# Patient Record
Sex: Male | Born: 1935 | Race: White | Hispanic: No | Smoking: Former smoker
Health system: Southern US, Community
[De-identification: ages and names within clinical notes are randomized; demographics above are authoritative.]

## PROBLEM LIST (undated history)

## (undated) DIAGNOSIS — C61 Malignant neoplasm of prostate: Secondary | ICD-10-CM

## (undated) DIAGNOSIS — T4145XA Adverse effect of unspecified anesthetic, initial encounter: Secondary | ICD-10-CM

## (undated) DIAGNOSIS — M720 Palmar fascial fibromatosis [Dupuytren]: Secondary | ICD-10-CM

## (undated) DIAGNOSIS — T8859XA Other complications of anesthesia, initial encounter: Secondary | ICD-10-CM

## (undated) DIAGNOSIS — E785 Hyperlipidemia, unspecified: Secondary | ICD-10-CM

## (undated) HISTORY — PX: ROTATOR CUFF REPAIR: SHX139

---

## 2004-05-01 ENCOUNTER — Encounter: Admission: RE | Admit: 2004-05-01 | Discharge: 2004-05-01 | Payer: Self-pay | Admitting: *Deleted

## 2004-05-02 ENCOUNTER — Ambulatory Visit (HOSPITAL_BASED_OUTPATIENT_CLINIC_OR_DEPARTMENT_OTHER): Admission: RE | Admit: 2004-05-02 | Discharge: 2004-05-02 | Payer: Self-pay | Admitting: *Deleted

## 2004-05-02 ENCOUNTER — Ambulatory Visit (HOSPITAL_COMMUNITY): Admission: RE | Admit: 2004-05-02 | Discharge: 2004-05-02 | Payer: Self-pay | Admitting: *Deleted

## 2016-12-16 DIAGNOSIS — Z23 Encounter for immunization: Secondary | ICD-10-CM | POA: Diagnosis not present

## 2016-12-20 DIAGNOSIS — R39198 Other difficulties with micturition: Secondary | ICD-10-CM | POA: Diagnosis not present

## 2016-12-20 DIAGNOSIS — N401 Enlarged prostate with lower urinary tract symptoms: Secondary | ICD-10-CM | POA: Diagnosis not present

## 2016-12-30 DIAGNOSIS — R7989 Other specified abnormal findings of blood chemistry: Secondary | ICD-10-CM | POA: Diagnosis not present

## 2017-02-14 DIAGNOSIS — H524 Presbyopia: Secondary | ICD-10-CM | POA: Diagnosis not present

## 2017-02-14 DIAGNOSIS — Z0100 Encounter for examination of eyes and vision without abnormal findings: Secondary | ICD-10-CM | POA: Diagnosis not present

## 2017-02-26 DIAGNOSIS — N400 Enlarged prostate without lower urinary tract symptoms: Secondary | ICD-10-CM | POA: Diagnosis not present

## 2017-02-26 DIAGNOSIS — E7800 Pure hypercholesterolemia, unspecified: Secondary | ICD-10-CM | POA: Diagnosis not present

## 2017-02-26 DIAGNOSIS — R7989 Other specified abnormal findings of blood chemistry: Secondary | ICD-10-CM | POA: Diagnosis not present

## 2017-02-26 DIAGNOSIS — Z299 Encounter for prophylactic measures, unspecified: Secondary | ICD-10-CM | POA: Diagnosis not present

## 2017-02-26 DIAGNOSIS — Z713 Dietary counseling and surveillance: Secondary | ICD-10-CM | POA: Diagnosis not present

## 2017-02-26 DIAGNOSIS — R972 Elevated prostate specific antigen [PSA]: Secondary | ICD-10-CM | POA: Diagnosis not present

## 2017-05-05 DIAGNOSIS — R69 Illness, unspecified: Secondary | ICD-10-CM | POA: Diagnosis not present

## 2017-05-12 DIAGNOSIS — L570 Actinic keratosis: Secondary | ICD-10-CM | POA: Diagnosis not present

## 2017-05-12 DIAGNOSIS — Z85828 Personal history of other malignant neoplasm of skin: Secondary | ICD-10-CM | POA: Diagnosis not present

## 2017-11-11 DIAGNOSIS — Z85828 Personal history of other malignant neoplasm of skin: Secondary | ICD-10-CM | POA: Diagnosis not present

## 2017-11-11 DIAGNOSIS — D485 Neoplasm of uncertain behavior of skin: Secondary | ICD-10-CM | POA: Diagnosis not present

## 2017-11-11 DIAGNOSIS — L570 Actinic keratosis: Secondary | ICD-10-CM | POA: Diagnosis not present

## 2017-11-20 DIAGNOSIS — R69 Illness, unspecified: Secondary | ICD-10-CM | POA: Diagnosis not present

## 2017-12-09 DIAGNOSIS — Z299 Encounter for prophylactic measures, unspecified: Secondary | ICD-10-CM | POA: Diagnosis not present

## 2017-12-09 DIAGNOSIS — E7800 Pure hypercholesterolemia, unspecified: Secondary | ICD-10-CM | POA: Diagnosis not present

## 2017-12-09 DIAGNOSIS — Z1339 Encounter for screening examination for other mental health and behavioral disorders: Secondary | ICD-10-CM | POA: Diagnosis not present

## 2017-12-09 DIAGNOSIS — Z7189 Other specified counseling: Secondary | ICD-10-CM | POA: Diagnosis not present

## 2017-12-09 DIAGNOSIS — Z1211 Encounter for screening for malignant neoplasm of colon: Secondary | ICD-10-CM | POA: Diagnosis not present

## 2017-12-09 DIAGNOSIS — Z79899 Other long term (current) drug therapy: Secondary | ICD-10-CM | POA: Diagnosis not present

## 2017-12-09 DIAGNOSIS — Z1331 Encounter for screening for depression: Secondary | ICD-10-CM | POA: Diagnosis not present

## 2017-12-09 DIAGNOSIS — R5383 Other fatigue: Secondary | ICD-10-CM | POA: Diagnosis not present

## 2017-12-09 DIAGNOSIS — Z0000 Encounter for general adult medical examination without abnormal findings: Secondary | ICD-10-CM | POA: Diagnosis not present

## 2017-12-10 DIAGNOSIS — Z125 Encounter for screening for malignant neoplasm of prostate: Secondary | ICD-10-CM | POA: Diagnosis not present

## 2017-12-10 DIAGNOSIS — Z0000 Encounter for general adult medical examination without abnormal findings: Secondary | ICD-10-CM | POA: Diagnosis not present

## 2017-12-10 DIAGNOSIS — E7800 Pure hypercholesterolemia, unspecified: Secondary | ICD-10-CM | POA: Diagnosis not present

## 2017-12-10 DIAGNOSIS — Z79899 Other long term (current) drug therapy: Secondary | ICD-10-CM | POA: Diagnosis not present

## 2017-12-10 DIAGNOSIS — R5383 Other fatigue: Secondary | ICD-10-CM | POA: Diagnosis not present

## 2017-12-20 DIAGNOSIS — R69 Illness, unspecified: Secondary | ICD-10-CM | POA: Diagnosis not present

## 2018-05-12 DIAGNOSIS — L570 Actinic keratosis: Secondary | ICD-10-CM | POA: Diagnosis not present

## 2018-05-12 DIAGNOSIS — Z85828 Personal history of other malignant neoplasm of skin: Secondary | ICD-10-CM | POA: Diagnosis not present

## 2018-05-12 DIAGNOSIS — L821 Other seborrheic keratosis: Secondary | ICD-10-CM | POA: Diagnosis not present

## 2018-06-01 DIAGNOSIS — R69 Illness, unspecified: Secondary | ICD-10-CM | POA: Diagnosis not present

## 2018-07-13 DIAGNOSIS — H1131 Conjunctival hemorrhage, right eye: Secondary | ICD-10-CM | POA: Diagnosis not present

## 2018-09-07 DIAGNOSIS — L509 Urticaria, unspecified: Secondary | ICD-10-CM | POA: Diagnosis not present

## 2018-09-07 DIAGNOSIS — Z91038 Other insect allergy status: Secondary | ICD-10-CM | POA: Diagnosis not present

## 2018-09-14 DIAGNOSIS — Z713 Dietary counseling and surveillance: Secondary | ICD-10-CM | POA: Diagnosis not present

## 2018-09-14 DIAGNOSIS — R69 Illness, unspecified: Secondary | ICD-10-CM | POA: Diagnosis not present

## 2018-09-14 DIAGNOSIS — Z299 Encounter for prophylactic measures, unspecified: Secondary | ICD-10-CM | POA: Diagnosis not present

## 2018-09-14 DIAGNOSIS — M71349 Other bursal cyst, unspecified hand: Secondary | ICD-10-CM | POA: Diagnosis not present

## 2018-09-14 DIAGNOSIS — E7800 Pure hypercholesterolemia, unspecified: Secondary | ICD-10-CM | POA: Diagnosis not present

## 2018-09-14 DIAGNOSIS — Z6824 Body mass index (BMI) 24.0-24.9, adult: Secondary | ICD-10-CM | POA: Diagnosis not present

## 2018-09-21 DIAGNOSIS — M79645 Pain in left finger(s): Secondary | ICD-10-CM | POA: Diagnosis not present

## 2018-09-21 DIAGNOSIS — M720 Palmar fascial fibromatosis [Dupuytren]: Secondary | ICD-10-CM | POA: Diagnosis not present

## 2018-09-21 DIAGNOSIS — R229 Localized swelling, mass and lump, unspecified: Secondary | ICD-10-CM | POA: Diagnosis not present

## 2018-10-01 ENCOUNTER — Other Ambulatory Visit: Payer: Self-pay | Admitting: Orthopedic Surgery

## 2018-10-01 DIAGNOSIS — IMO0002 Reserved for concepts with insufficient information to code with codable children: Secondary | ICD-10-CM

## 2018-10-01 DIAGNOSIS — R229 Localized swelling, mass and lump, unspecified: Principal | ICD-10-CM

## 2018-10-07 ENCOUNTER — Ambulatory Visit
Admission: RE | Admit: 2018-10-07 | Discharge: 2018-10-07 | Disposition: A | Discharge status: Home / Self Care | Payer: Medicare HMO | Source: Ambulatory Visit | Attending: Orthopedic Surgery | Admitting: Orthopedic Surgery

## 2018-10-07 DIAGNOSIS — IMO0002 Reserved for concepts with insufficient information to code with codable children: Secondary | ICD-10-CM

## 2018-10-07 DIAGNOSIS — R2231 Localized swelling, mass and lump, right upper limb: Secondary | ICD-10-CM | POA: Diagnosis not present

## 2018-10-07 DIAGNOSIS — R229 Localized swelling, mass and lump, unspecified: Principal | ICD-10-CM

## 2018-10-12 DIAGNOSIS — R229 Localized swelling, mass and lump, unspecified: Secondary | ICD-10-CM | POA: Diagnosis not present

## 2018-10-26 DIAGNOSIS — M720 Palmar fascial fibromatosis [Dupuytren]: Secondary | ICD-10-CM | POA: Diagnosis not present

## 2018-10-26 DIAGNOSIS — R229 Localized swelling, mass and lump, unspecified: Secondary | ICD-10-CM | POA: Diagnosis not present

## 2018-10-28 DIAGNOSIS — R69 Illness, unspecified: Secondary | ICD-10-CM | POA: Diagnosis not present

## 2018-11-11 DIAGNOSIS — Z85828 Personal history of other malignant neoplasm of skin: Secondary | ICD-10-CM | POA: Diagnosis not present

## 2018-11-11 DIAGNOSIS — L821 Other seborrheic keratosis: Secondary | ICD-10-CM | POA: Diagnosis not present

## 2018-11-11 DIAGNOSIS — L570 Actinic keratosis: Secondary | ICD-10-CM | POA: Diagnosis not present

## 2018-12-16 DIAGNOSIS — Z0000 Encounter for general adult medical examination without abnormal findings: Secondary | ICD-10-CM | POA: Diagnosis not present

## 2018-12-16 DIAGNOSIS — R5383 Other fatigue: Secondary | ICD-10-CM | POA: Diagnosis not present

## 2018-12-16 DIAGNOSIS — Z7189 Other specified counseling: Secondary | ICD-10-CM | POA: Diagnosis not present

## 2018-12-16 DIAGNOSIS — Z1211 Encounter for screening for malignant neoplasm of colon: Secondary | ICD-10-CM | POA: Diagnosis not present

## 2018-12-16 DIAGNOSIS — E7800 Pure hypercholesterolemia, unspecified: Secondary | ICD-10-CM | POA: Diagnosis not present

## 2018-12-16 DIAGNOSIS — Z6824 Body mass index (BMI) 24.0-24.9, adult: Secondary | ICD-10-CM | POA: Diagnosis not present

## 2018-12-16 DIAGNOSIS — Z79899 Other long term (current) drug therapy: Secondary | ICD-10-CM | POA: Diagnosis not present

## 2018-12-16 DIAGNOSIS — Z299 Encounter for prophylactic measures, unspecified: Secondary | ICD-10-CM | POA: Diagnosis not present

## 2018-12-16 DIAGNOSIS — Z125 Encounter for screening for malignant neoplasm of prostate: Secondary | ICD-10-CM | POA: Diagnosis not present

## 2018-12-16 DIAGNOSIS — N400 Enlarged prostate without lower urinary tract symptoms: Secondary | ICD-10-CM | POA: Diagnosis not present

## 2018-12-16 DIAGNOSIS — Z1339 Encounter for screening examination for other mental health and behavioral disorders: Secondary | ICD-10-CM | POA: Diagnosis not present

## 2018-12-16 DIAGNOSIS — Z1331 Encounter for screening for depression: Secondary | ICD-10-CM | POA: Diagnosis not present

## 2018-12-25 DIAGNOSIS — M1611 Unilateral primary osteoarthritis, right hip: Secondary | ICD-10-CM | POA: Diagnosis not present

## 2018-12-25 DIAGNOSIS — M7071 Other bursitis of hip, right hip: Secondary | ICD-10-CM | POA: Diagnosis not present

## 2018-12-25 DIAGNOSIS — Z299 Encounter for prophylactic measures, unspecified: Secondary | ICD-10-CM | POA: Diagnosis not present

## 2018-12-25 DIAGNOSIS — Z6824 Body mass index (BMI) 24.0-24.9, adult: Secondary | ICD-10-CM | POA: Diagnosis not present

## 2018-12-25 DIAGNOSIS — M7070 Other bursitis of hip, unspecified hip: Secondary | ICD-10-CM | POA: Diagnosis not present

## 2018-12-25 DIAGNOSIS — E7800 Pure hypercholesterolemia, unspecified: Secondary | ICD-10-CM | POA: Diagnosis not present

## 2019-01-22 DIAGNOSIS — M7061 Trochanteric bursitis, right hip: Secondary | ICD-10-CM | POA: Diagnosis not present

## 2019-01-22 DIAGNOSIS — R936 Abnormal findings on diagnostic imaging of limbs: Secondary | ICD-10-CM | POA: Diagnosis not present

## 2019-01-22 DIAGNOSIS — Z299 Encounter for prophylactic measures, unspecified: Secondary | ICD-10-CM | POA: Diagnosis not present

## 2019-01-22 DIAGNOSIS — M25551 Pain in right hip: Secondary | ICD-10-CM | POA: Diagnosis not present

## 2019-01-22 DIAGNOSIS — Z6824 Body mass index (BMI) 24.0-24.9, adult: Secondary | ICD-10-CM | POA: Diagnosis not present

## 2019-01-22 DIAGNOSIS — M1611 Unilateral primary osteoarthritis, right hip: Secondary | ICD-10-CM | POA: Diagnosis not present

## 2019-02-05 DIAGNOSIS — R9389 Abnormal findings on diagnostic imaging of other specified body structures: Secondary | ICD-10-CM | POA: Diagnosis not present

## 2019-02-05 DIAGNOSIS — Z299 Encounter for prophylactic measures, unspecified: Secondary | ICD-10-CM | POA: Diagnosis not present

## 2019-02-05 DIAGNOSIS — M25551 Pain in right hip: Secondary | ICD-10-CM | POA: Diagnosis not present

## 2019-02-05 DIAGNOSIS — Z6824 Body mass index (BMI) 24.0-24.9, adult: Secondary | ICD-10-CM | POA: Diagnosis not present

## 2019-02-09 DIAGNOSIS — M899 Disorder of bone, unspecified: Secondary | ICD-10-CM | POA: Diagnosis not present

## 2019-02-09 DIAGNOSIS — R1900 Intra-abdominal and pelvic swelling, mass and lump, unspecified site: Secondary | ICD-10-CM | POA: Diagnosis not present

## 2019-02-09 DIAGNOSIS — R9389 Abnormal findings on diagnostic imaging of other specified body structures: Secondary | ICD-10-CM | POA: Diagnosis not present

## 2019-02-19 DIAGNOSIS — C7951 Secondary malignant neoplasm of bone: Secondary | ICD-10-CM | POA: Diagnosis not present

## 2019-02-19 DIAGNOSIS — M25551 Pain in right hip: Secondary | ICD-10-CM | POA: Diagnosis not present

## 2019-02-19 DIAGNOSIS — Z6824 Body mass index (BMI) 24.0-24.9, adult: Secondary | ICD-10-CM | POA: Diagnosis not present

## 2019-02-19 DIAGNOSIS — Z299 Encounter for prophylactic measures, unspecified: Secondary | ICD-10-CM | POA: Diagnosis not present

## 2019-02-19 DIAGNOSIS — E7800 Pure hypercholesterolemia, unspecified: Secondary | ICD-10-CM | POA: Diagnosis not present

## 2019-02-24 DIAGNOSIS — M899 Disorder of bone, unspecified: Secondary | ICD-10-CM | POA: Diagnosis not present

## 2019-02-24 DIAGNOSIS — M8588 Other specified disorders of bone density and structure, other site: Secondary | ICD-10-CM | POA: Diagnosis not present

## 2019-02-24 DIAGNOSIS — R935 Abnormal findings on diagnostic imaging of other abdominal regions, including retroperitoneum: Secondary | ICD-10-CM | POA: Diagnosis not present

## 2019-03-02 DIAGNOSIS — N400 Enlarged prostate without lower urinary tract symptoms: Secondary | ICD-10-CM | POA: Diagnosis not present

## 2019-03-02 DIAGNOSIS — Z6824 Body mass index (BMI) 24.0-24.9, adult: Secondary | ICD-10-CM | POA: Diagnosis not present

## 2019-03-02 DIAGNOSIS — R948 Abnormal results of function studies of other organs and systems: Secondary | ICD-10-CM | POA: Diagnosis not present

## 2019-03-02 DIAGNOSIS — R972 Elevated prostate specific antigen [PSA]: Secondary | ICD-10-CM | POA: Diagnosis not present

## 2019-03-02 DIAGNOSIS — C7951 Secondary malignant neoplasm of bone: Secondary | ICD-10-CM | POA: Diagnosis not present

## 2019-03-02 DIAGNOSIS — Z299 Encounter for prophylactic measures, unspecified: Secondary | ICD-10-CM | POA: Diagnosis not present

## 2019-03-15 DIAGNOSIS — R948 Abnormal results of function studies of other organs and systems: Secondary | ICD-10-CM | POA: Diagnosis not present

## 2019-03-15 DIAGNOSIS — R789 Finding of unspecified substance, not normally found in blood: Secondary | ICD-10-CM | POA: Diagnosis not present

## 2019-03-15 DIAGNOSIS — C61 Malignant neoplasm of prostate: Secondary | ICD-10-CM | POA: Diagnosis not present

## 2019-03-15 DIAGNOSIS — R972 Elevated prostate specific antigen [PSA]: Secondary | ICD-10-CM | POA: Diagnosis not present

## 2019-03-15 DIAGNOSIS — M899 Disorder of bone, unspecified: Secondary | ICD-10-CM | POA: Diagnosis not present

## 2019-03-22 DIAGNOSIS — R948 Abnormal results of function studies of other organs and systems: Secondary | ICD-10-CM | POA: Diagnosis not present

## 2019-03-22 DIAGNOSIS — C61 Malignant neoplasm of prostate: Secondary | ICD-10-CM | POA: Diagnosis not present

## 2019-03-22 DIAGNOSIS — M899 Disorder of bone, unspecified: Secondary | ICD-10-CM | POA: Diagnosis not present

## 2019-03-22 DIAGNOSIS — I700 Atherosclerosis of aorta: Secondary | ICD-10-CM | POA: Diagnosis not present

## 2019-03-22 DIAGNOSIS — N281 Cyst of kidney, acquired: Secondary | ICD-10-CM | POA: Diagnosis not present

## 2019-03-22 DIAGNOSIS — R789 Finding of unspecified substance, not normally found in blood: Secondary | ICD-10-CM | POA: Diagnosis not present

## 2019-03-22 DIAGNOSIS — K7689 Other specified diseases of liver: Secondary | ICD-10-CM | POA: Diagnosis not present

## 2019-03-22 DIAGNOSIS — R972 Elevated prostate specific antigen [PSA]: Secondary | ICD-10-CM | POA: Diagnosis not present

## 2019-03-25 ENCOUNTER — Other Ambulatory Visit (HOSPITAL_COMMUNITY): Payer: Self-pay | Admitting: Oncology

## 2019-03-25 DIAGNOSIS — M899 Disorder of bone, unspecified: Secondary | ICD-10-CM

## 2019-03-25 DIAGNOSIS — R972 Elevated prostate specific antigen [PSA]: Secondary | ICD-10-CM

## 2019-03-25 DIAGNOSIS — R948 Abnormal results of function studies of other organs and systems: Secondary | ICD-10-CM

## 2019-03-26 ENCOUNTER — Other Ambulatory Visit: Payer: Self-pay | Admitting: Radiology

## 2019-03-26 ENCOUNTER — Other Ambulatory Visit: Payer: Self-pay | Admitting: Student

## 2019-03-29 ENCOUNTER — Other Ambulatory Visit: Payer: Self-pay

## 2019-03-29 ENCOUNTER — Ambulatory Visit (HOSPITAL_COMMUNITY)
Admission: RE | Admit: 2019-03-29 | Discharge: 2019-03-29 | Disposition: A | Discharge status: Home / Self Care | Payer: Medicare HMO | Source: Ambulatory Visit | Attending: Oncology | Admitting: Oncology

## 2019-03-29 ENCOUNTER — Encounter (HOSPITAL_COMMUNITY): Payer: Self-pay

## 2019-03-29 DIAGNOSIS — Z8546 Personal history of malignant neoplasm of prostate: Secondary | ICD-10-CM | POA: Insufficient documentation

## 2019-03-29 DIAGNOSIS — C7951 Secondary malignant neoplasm of bone: Secondary | ICD-10-CM | POA: Insufficient documentation

## 2019-03-29 DIAGNOSIS — M8588 Other specified disorders of bone density and structure, other site: Secondary | ICD-10-CM | POA: Diagnosis not present

## 2019-03-29 DIAGNOSIS — M899 Disorder of bone, unspecified: Secondary | ICD-10-CM | POA: Diagnosis not present

## 2019-03-29 DIAGNOSIS — C801 Malignant (primary) neoplasm, unspecified: Secondary | ICD-10-CM | POA: Diagnosis not present

## 2019-03-29 DIAGNOSIS — M898X8 Other specified disorders of bone, other site: Secondary | ICD-10-CM | POA: Diagnosis not present

## 2019-03-29 DIAGNOSIS — M720 Palmar fascial fibromatosis [Dupuytren]: Secondary | ICD-10-CM | POA: Diagnosis not present

## 2019-03-29 DIAGNOSIS — R948 Abnormal results of function studies of other organs and systems: Secondary | ICD-10-CM | POA: Diagnosis present

## 2019-03-29 DIAGNOSIS — Z87891 Personal history of nicotine dependence: Secondary | ICD-10-CM | POA: Insufficient documentation

## 2019-03-29 DIAGNOSIS — R972 Elevated prostate specific antigen [PSA]: Secondary | ICD-10-CM | POA: Diagnosis not present

## 2019-03-29 HISTORY — DX: Other complications of anesthesia, initial encounter: T88.59XA

## 2019-03-29 HISTORY — DX: Palmar fascial fibromatosis (dupuytren): M72.0

## 2019-03-29 HISTORY — PX: IR FLUORO GUIDED NEEDLE PLC ASPIRATION/INJECTION LOC: IMG2395

## 2019-03-29 HISTORY — DX: Adverse effect of unspecified anesthetic, initial encounter: T41.45XA

## 2019-03-29 LAB — CBC
HCT: 45.9 % (ref 39.0–52.0)
Hemoglobin: 15.3 g/dL (ref 13.0–17.0)
MCH: 32.3 pg (ref 26.0–34.0)
MCHC: 33.3 g/dL (ref 30.0–36.0)
MCV: 96.8 fL (ref 80.0–100.0)
Platelets: 148 10*3/uL — ABNORMAL LOW (ref 150–400)
RBC: 4.74 MIL/uL (ref 4.22–5.81)
RDW: 13.1 % (ref 11.5–15.5)
WBC: 4.5 10*3/uL (ref 4.0–10.5)
nRBC: 0 % (ref 0.0–0.2)

## 2019-03-29 LAB — PROTIME-INR
INR: 1 (ref 0.8–1.2)
Prothrombin Time: 12.9 seconds (ref 11.4–15.2)

## 2019-03-29 LAB — APTT: aPTT: 28 seconds (ref 24–36)

## 2019-03-29 MED ORDER — MIDAZOLAM HCL 2 MG/2ML IJ SOLN
INTRAMUSCULAR | Status: AC | PRN
  Administered 2019-03-29: 0.5 mg via INTRAVENOUS
  Administered 2019-03-29: 1 mg via INTRAVENOUS
  Administered 2019-03-29: 0.5 mg via INTRAVENOUS

## 2019-03-29 MED ORDER — MIDAZOLAM HCL 2 MG/2ML IJ SOLN
INTRAMUSCULAR | Status: AC
  Filled 2019-03-29: qty 2

## 2019-03-29 MED ORDER — LIDOCAINE HCL (PF) 1 % IJ SOLN
INTRAMUSCULAR | Status: AC
  Filled 2019-03-29: qty 30

## 2019-03-29 MED ORDER — SODIUM CHLORIDE 0.9 % IV SOLN
INTRAVENOUS | Status: DC
  Administered 2019-03-29: 13:00:00 via INTRAVENOUS

## 2019-03-29 MED ORDER — LIDOCAINE HCL (PF) 1 % IJ SOLN
INTRAMUSCULAR | Status: AC | PRN
  Administered 2019-03-29: 5 mL

## 2019-03-29 MED ORDER — FENTANYL CITRATE (PF) 100 MCG/2ML IJ SOLN
INTRAMUSCULAR | Status: AC
  Filled 2019-03-29: qty 2

## 2019-03-29 MED ORDER — FENTANYL CITRATE (PF) 100 MCG/2ML IJ SOLN
INTRAMUSCULAR | Status: AC | PRN
  Administered 2019-03-29 (×2): 50 ug via INTRAVENOUS

## 2019-03-29 MED ORDER — HYDROCODONE-ACETAMINOPHEN 5-325 MG PO TABS: 1.0000 | ORAL_TABLET | ORAL | Status: DC | PRN

## 2019-03-29 NOTE — Procedures (Signed)
Interventional Radiology Procedure Note  Procedure: Left L2 transpedicular biopsy of sclerotic bone lesion  Complications: None  Estimated Blood Loss: <25 mL  Recommendations: - Path pending - Bedrest x 2 hrs - DC home  Signed,  Criselda Peaches, MD

## 2019-03-29 NOTE — Consult Note (Signed)
Chief Complaint: Patient was seen in consultation today for image guided L2 lesion biopsy  Referring Physician(s): Marion C  Supervising Physician: Jacqulynn Cadet  Patient Status: [facility] - Out-pt  History of Present Illness: Jesse Walter is an 85 y.o. male , former smoker, with history of right hip pain, enlarged prostate with elevated PSA and outside imaging revealing multiple sclerotic bony lesions of uncertain etiology.  He presents today for image guided transpedicular L2 lesion biopsy for further evaluation.  Past Medical History:  Diagnosis Date  . Complication of anesthesia    difficulty urinating after anesthesia, ED for urinary retention after  . Dupuytren's contracture of both hands     History reviewed. No pertinent surgical history.  Allergies: Patient has no allergy information on record.  Medications: Prior to Admission medications   Medication Sig Start Date End Date Taking? Authorizing Provider  Dutasteride-Tamsulosin HCl (JALYN) 0.5-0.4 MG CAPS Take by mouth daily.   Yes [provider]  Ibuprofen-diphenhydrAMINE HCl (ADVIL PM) 200-25 MG CAPS Take by mouth at bedtime as needed.   Yes [provider]  Multiple Vitamins-Minerals (MULTIVITAMIN WITH MINERALS) tablet Take 1 tablet by mouth daily.   Yes [provider]  Omega-3 Fatty Acids (FISH OIL) 1000 MG CAPS Take by mouth daily.   Yes [provider]     History reviewed. No pertinent family history.  Social History   Socioeconomic History  . Marital status: Widowed    Spouse name: Not on file  . Number of children: Not on file  . Years of education: Not on file  . Highest education level: Not on file  Occupational History  . Not on file  Social Needs  . Financial resource strain: Not on file  . Food insecurity:    Worry: Not on file    Inability: Not on file  . Transportation needs:    Medical: Not on file    Non-medical: Not on file   Tobacco Use  . Smoking status: Not on file  Substance and Sexual Activity  . Alcohol use: Not on file  . Drug use: Not on file  . Sexual activity: Not on file  Lifestyle  . Physical activity:    Days per week: Not on file    Minutes per session: Not on file  . Stress: Not on file  Relationships  . Social connections:    Talks on phone: Not on file    Gets together: Not on file    Attends religious service: Not on file    Active member of club or organization: Not on file    Attends meetings of clubs or organizations: Not on file    Relationship status: Not on file  Other Topics Concern  . Not on file  Social History Narrative  . Not on file      Review of Systems see above; denies fever, headache, chest pain, dyspnea, cough, abdominal pain, back pain, nausea, vomiting or bleeding.  Vital Signs: BP (!) 161/98 (BP Location: Right Arm)   Pulse 81   Temp 98.3 F (36.8 C) (Oral)   Resp 18   Ht 6' (1.829 m)   Wt 170 lb (77.1 kg)   SpO2 100%   BMI 23.06 kg/m   Physical Exam awake, alert.  Chest clear to auscultation bilaterally.  Heart with regular rate and rhythm.  Abdomen soft, positive bowel sounds, nontender.  No lower extremity edema.  Imaging: No results found.  Labs:  CBC: Recent Labs  03/29/19 1230  WBC 4.5  HGB 15.3  HCT 45.9  PLT 148*    COAGS: Recent Labs    03/29/19 1230  INR 1.0  APTT 28    BMP: No results for input(s): NA, K, CL, CO2, GLUCOSE, BUN, CALCIUM, CREATININE, GFRNONAA, GFRAA in the last 8760 hours.  Invalid input(s): CMP  LIVER FUNCTION TESTS: No results for input(s): BILITOT, AST, ALT, ALKPHOS, PROT, ALBUMIN in the last 8760 hours.  TUMOR MARKERS: No results for input(s): AFPTM, CEA, CA199, CHROMGRNA in the last 8760 hours.  Assessment and Plan: 85 y.o. male , former smoker, with history of right hip pain, enlarged prostate with elevated PSA and outside imaging revealing multiple sclerotic bony lesions of uncertain  etiology.  He presents today for image guided transpedicular L2 lesion biopsy for further evaluation.Risks and benefits of procedure was discussed with the patient  including, but not limited to bleeding, infection, damage to adjacent structures or low yield requiring additional tests.  All of the questions were answered and there is agreement to proceed.  Consent signed and in chart.     Thank you for this interesting consult.  I greatly enjoyed meeting Jesse Walter and look forward to participating in their care.  A copy of this report was sent to the requesting provider on this date.  Electronically Signed: D. Rowe Robert, PA-C 03/29/2019, 1:11 PM   I spent a total of 25 minutes  in face to face in clinical consultation, greater than 50% of which was counseling/coordinating care for image guided L2 lesion biopsy

## 2019-03-29 NOTE — Discharge Instructions (Signed)
Bone Marrow Aspiration and Bone Marrow Biopsy, Adult, Care After This sheet gives you information about how to care for yourself after your procedure. Your health care provider may also give you more specific instructions. If you have problems or questions, contact your health care provider. What can I expect after the procedure? After the procedure, it is common to have:  Mild pain and tenderness.  Swelling.  Bruising. Follow these instructions at home: Puncture site care      Follow instructions from your health care provider about how to take care of the puncture site. Make sure you: ? Wash your hands with soap and water before you change your bandage (dressing). If soap and water are not available, use hand sanitizer. ? Change your dressing as told by your health care provider.  Check your puncture siteevery day for signs of infection. Check for: ? More redness, swelling, or pain. ? More fluid or blood. ? Warmth. ? Pus or a bad smell. General instructions  Take over-the-counter and prescription medicines only as told by your health care provider.  Do not take baths, swim, or use a hot tub until your health care provider approves. Ask if you can take a shower or have a sponge bath.  Return to your normal activities as told by your health care provider. Ask your health care provider what activities are safe for you.  Do not drive for 24 hours if you were given a medicine to help you relax (sedative) during your procedure.  Keep all follow-up visits as told by your health care provider. This is important. Contact a health care provider if:  Your pain is not controlled with medicine. Get help right away if:  You have a fever.  You have more redness, swelling, or pain around the puncture site.  You have more fluid or blood coming from the puncture site.  Your puncture site feels warm to the touch.  You have pus or a bad smell coming from the puncture site. These  symptoms may represent a serious problem that is an emergency. Do not wait to see if the symptoms will go away. Get medical help right away. Call your local emergency services (911 in the [place] Do not drive yourself to the hospital. Summary  After the procedure, it is common to have mild pain, tenderness, swelling, and bruising.  Follow instructions from your health care provider about how to take care of the puncture site.  May remove dressing and shower or bathe 24 hours following your procedure.  Keep site clean and dry and replace bandage with bandaid as necessary.  Get help right away if you have any symptoms of infection or if you have more blood or fluid coming from the puncture site.  No straining or heavy lifting for several days. This information is not intended to replace advice given to you by your health care provider. Make sure you discuss any questions you have with your health care provider. Document Released: 05/31/2005 Document Revised: 02/24/2018 Document Reviewed: 04/24/2016 Elsevier Interactive Patient Education  2019 [employer].   Moderate Conscious Sedation, Adult, Care After These instructions provide you with information about caring for yourself after your procedure. Your health care provider may also give you more specific instructions. Your treatment has been planned according to current medical practices, but problems sometimes occur. Call your health care provider if you have any problems or questions after your procedure. What can I expect after the procedure? After your procedure, it is common:  To feel sleepy for several hours.  To feel clumsy and have poor balance for several hours.  To have poor judgment for several hours.  To vomit if you eat too soon. Follow these instructions at home: For at least 24 hours after the procedure:   Do not: ? Participate in activities where you could fall or become injured. ? Drive. ? Use heavy  machinery. ? Drink alcohol. ? Take sleeping pills or medicines that cause drowsiness. ? Make important decisions or sign legal documents. ? Take care of children on your own.  Rest. Eating and drinking  Follow the diet recommended by your health care provider.  If you vomit: ? Drink water, juice, or soup when you can drink without vomiting. ? Make sure you have little or no nausea before eating solid foods. General instructions  Have a responsible adult stay with you until you are awake and alert.  Take over-the-counter and prescription medicines only as told by your health care provider.  If you smoke, do not smoke without supervision.  Keep all follow-up visits as told by your health care provider. This is important. Contact a health care provider if:  You keep feeling nauseous or you keep vomiting.  You feel light-headed.  You develop a rash.  You have a fever. Get help right away if:  You have trouble breathing. This information is not intended to replace advice given to you by your health care provider. Make sure you discuss any questions you have with your health care provider. Document Released: 09/01/2013 Document Revised: 04/15/2016 Document Reviewed: 03/02/2016 Elsevier Interactive Patient Education  2019 [employer].

## 2019-04-15 DIAGNOSIS — M899 Disorder of bone, unspecified: Secondary | ICD-10-CM | POA: Diagnosis not present

## 2019-04-15 DIAGNOSIS — C61 Malignant neoplasm of prostate: Secondary | ICD-10-CM | POA: Diagnosis not present

## 2019-04-15 DIAGNOSIS — R972 Elevated prostate specific antigen [PSA]: Secondary | ICD-10-CM | POA: Diagnosis not present

## 2019-04-15 DIAGNOSIS — C7951 Secondary malignant neoplasm of bone: Secondary | ICD-10-CM | POA: Diagnosis not present

## 2019-04-15 DIAGNOSIS — R948 Abnormal results of function studies of other organs and systems: Secondary | ICD-10-CM | POA: Diagnosis not present

## 2019-04-27 DIAGNOSIS — Z5111 Encounter for antineoplastic chemotherapy: Secondary | ICD-10-CM | POA: Diagnosis not present

## 2019-04-27 DIAGNOSIS — C61 Malignant neoplasm of prostate: Secondary | ICD-10-CM | POA: Diagnosis not present

## 2019-04-27 DIAGNOSIS — C7951 Secondary malignant neoplasm of bone: Secondary | ICD-10-CM | POA: Diagnosis not present

## 2019-04-29 DIAGNOSIS — R69 Illness, unspecified: Secondary | ICD-10-CM | POA: Diagnosis not present

## 2019-05-04 DIAGNOSIS — Z85828 Personal history of other malignant neoplasm of skin: Secondary | ICD-10-CM | POA: Diagnosis not present

## 2019-05-04 DIAGNOSIS — L570 Actinic keratosis: Secondary | ICD-10-CM | POA: Diagnosis not present

## 2019-05-04 DIAGNOSIS — L821 Other seborrheic keratosis: Secondary | ICD-10-CM | POA: Diagnosis not present

## 2019-05-10 DIAGNOSIS — C61 Malignant neoplasm of prostate: Secondary | ICD-10-CM | POA: Diagnosis not present

## 2019-05-10 DIAGNOSIS — C7951 Secondary malignant neoplasm of bone: Secondary | ICD-10-CM | POA: Diagnosis not present

## 2019-05-17 DIAGNOSIS — C7951 Secondary malignant neoplasm of bone: Secondary | ICD-10-CM | POA: Diagnosis not present

## 2019-05-17 DIAGNOSIS — R948 Abnormal results of function studies of other organs and systems: Secondary | ICD-10-CM | POA: Diagnosis not present

## 2019-05-17 DIAGNOSIS — R972 Elevated prostate specific antigen [PSA]: Secondary | ICD-10-CM | POA: Diagnosis not present

## 2019-05-17 DIAGNOSIS — Z79818 Long term (current) use of other agents affecting estrogen receptors and estrogen levels: Secondary | ICD-10-CM | POA: Diagnosis not present

## 2019-05-17 DIAGNOSIS — C61 Malignant neoplasm of prostate: Secondary | ICD-10-CM | POA: Diagnosis not present

## 2019-06-08 DIAGNOSIS — C7951 Secondary malignant neoplasm of bone: Secondary | ICD-10-CM | POA: Diagnosis not present

## 2019-06-08 DIAGNOSIS — C61 Malignant neoplasm of prostate: Secondary | ICD-10-CM | POA: Diagnosis not present

## 2019-07-12 DIAGNOSIS — C7951 Secondary malignant neoplasm of bone: Secondary | ICD-10-CM | POA: Diagnosis not present

## 2019-07-12 DIAGNOSIS — C61 Malignant neoplasm of prostate: Secondary | ICD-10-CM | POA: Diagnosis not present

## 2019-07-12 DIAGNOSIS — R948 Abnormal results of function studies of other organs and systems: Secondary | ICD-10-CM | POA: Diagnosis not present

## 2019-07-20 DIAGNOSIS — C7951 Secondary malignant neoplasm of bone: Secondary | ICD-10-CM | POA: Diagnosis not present

## 2019-07-20 DIAGNOSIS — C61 Malignant neoplasm of prostate: Secondary | ICD-10-CM | POA: Diagnosis not present

## 2019-07-20 DIAGNOSIS — Z79818 Long term (current) use of other agents affecting estrogen receptors and estrogen levels: Secondary | ICD-10-CM | POA: Diagnosis not present

## 2019-07-20 DIAGNOSIS — R972 Elevated prostate specific antigen [PSA]: Secondary | ICD-10-CM | POA: Diagnosis not present

## 2019-08-10 DIAGNOSIS — R972 Elevated prostate specific antigen [PSA]: Secondary | ICD-10-CM | POA: Diagnosis not present

## 2019-08-10 DIAGNOSIS — Z79818 Long term (current) use of other agents affecting estrogen receptors and estrogen levels: Secondary | ICD-10-CM | POA: Diagnosis not present

## 2019-08-10 DIAGNOSIS — C7951 Secondary malignant neoplasm of bone: Secondary | ICD-10-CM | POA: Diagnosis not present

## 2019-08-10 DIAGNOSIS — C61 Malignant neoplasm of prostate: Secondary | ICD-10-CM | POA: Diagnosis not present

## 2019-08-17 DIAGNOSIS — Z79818 Long term (current) use of other agents affecting estrogen receptors and estrogen levels: Secondary | ICD-10-CM | POA: Diagnosis not present

## 2019-08-17 DIAGNOSIS — Z79899 Other long term (current) drug therapy: Secondary | ICD-10-CM | POA: Diagnosis not present

## 2019-08-17 DIAGNOSIS — R972 Elevated prostate specific antigen [PSA]: Secondary | ICD-10-CM | POA: Diagnosis not present

## 2019-08-17 DIAGNOSIS — E539 Vitamin B deficiency, unspecified: Secondary | ICD-10-CM | POA: Diagnosis not present

## 2019-08-17 DIAGNOSIS — C7951 Secondary malignant neoplasm of bone: Secondary | ICD-10-CM | POA: Diagnosis not present

## 2019-08-17 DIAGNOSIS — Z87891 Personal history of nicotine dependence: Secondary | ICD-10-CM | POA: Diagnosis not present

## 2019-08-17 DIAGNOSIS — M1990 Unspecified osteoarthritis, unspecified site: Secondary | ICD-10-CM | POA: Diagnosis not present

## 2019-08-17 DIAGNOSIS — M25551 Pain in right hip: Secondary | ICD-10-CM | POA: Diagnosis not present

## 2019-08-17 DIAGNOSIS — D649 Anemia, unspecified: Secondary | ICD-10-CM | POA: Diagnosis not present

## 2019-08-17 DIAGNOSIS — E7800 Pure hypercholesterolemia, unspecified: Secondary | ICD-10-CM | POA: Diagnosis not present

## 2019-08-17 DIAGNOSIS — C61 Malignant neoplasm of prostate: Secondary | ICD-10-CM | POA: Diagnosis not present

## 2019-08-17 DIAGNOSIS — M899 Disorder of bone, unspecified: Secondary | ICD-10-CM | POA: Diagnosis not present

## 2019-09-08 DIAGNOSIS — D631 Anemia in chronic kidney disease: Secondary | ICD-10-CM | POA: Diagnosis not present

## 2019-09-08 DIAGNOSIS — C61 Malignant neoplasm of prostate: Secondary | ICD-10-CM | POA: Diagnosis not present

## 2019-09-08 DIAGNOSIS — R948 Abnormal results of function studies of other organs and systems: Secondary | ICD-10-CM | POA: Diagnosis not present

## 2019-09-08 DIAGNOSIS — D649 Anemia, unspecified: Secondary | ICD-10-CM | POA: Diagnosis not present

## 2019-09-08 DIAGNOSIS — E538 Deficiency of other specified B group vitamins: Secondary | ICD-10-CM | POA: Diagnosis not present

## 2019-09-08 DIAGNOSIS — C7951 Secondary malignant neoplasm of bone: Secondary | ICD-10-CM | POA: Diagnosis not present

## 2019-09-08 DIAGNOSIS — N189 Chronic kidney disease, unspecified: Secondary | ICD-10-CM | POA: Diagnosis not present

## 2019-09-08 DIAGNOSIS — Z79818 Long term (current) use of other agents affecting estrogen receptors and estrogen levels: Secondary | ICD-10-CM | POA: Diagnosis not present

## 2019-09-08 DIAGNOSIS — M899 Disorder of bone, unspecified: Secondary | ICD-10-CM | POA: Diagnosis not present

## 2019-09-14 DIAGNOSIS — R69 Illness, unspecified: Secondary | ICD-10-CM | POA: Diagnosis not present

## 2019-09-15 DIAGNOSIS — D631 Anemia in chronic kidney disease: Secondary | ICD-10-CM | POA: Diagnosis not present

## 2019-09-15 DIAGNOSIS — C7951 Secondary malignant neoplasm of bone: Secondary | ICD-10-CM | POA: Diagnosis not present

## 2019-09-15 DIAGNOSIS — R972 Elevated prostate specific antigen [PSA]: Secondary | ICD-10-CM | POA: Diagnosis not present

## 2019-09-15 DIAGNOSIS — N189 Chronic kidney disease, unspecified: Secondary | ICD-10-CM | POA: Diagnosis not present

## 2019-09-15 DIAGNOSIS — Z79818 Long term (current) use of other agents affecting estrogen receptors and estrogen levels: Secondary | ICD-10-CM | POA: Diagnosis not present

## 2019-09-15 DIAGNOSIS — C61 Malignant neoplasm of prostate: Secondary | ICD-10-CM | POA: Diagnosis not present

## 2019-10-11 DIAGNOSIS — D631 Anemia in chronic kidney disease: Secondary | ICD-10-CM | POA: Diagnosis not present

## 2019-10-11 DIAGNOSIS — C61 Malignant neoplasm of prostate: Secondary | ICD-10-CM | POA: Diagnosis not present

## 2019-10-11 DIAGNOSIS — E559 Vitamin D deficiency, unspecified: Secondary | ICD-10-CM | POA: Diagnosis not present

## 2019-10-11 DIAGNOSIS — N189 Chronic kidney disease, unspecified: Secondary | ICD-10-CM | POA: Diagnosis not present

## 2019-10-11 DIAGNOSIS — C7951 Secondary malignant neoplasm of bone: Secondary | ICD-10-CM | POA: Diagnosis not present

## 2019-10-14 DIAGNOSIS — D631 Anemia in chronic kidney disease: Secondary | ICD-10-CM | POA: Diagnosis not present

## 2019-10-14 DIAGNOSIS — C7951 Secondary malignant neoplasm of bone: Secondary | ICD-10-CM | POA: Diagnosis not present

## 2019-10-14 DIAGNOSIS — N189 Chronic kidney disease, unspecified: Secondary | ICD-10-CM | POA: Diagnosis not present

## 2019-10-14 DIAGNOSIS — C61 Malignant neoplasm of prostate: Secondary | ICD-10-CM | POA: Diagnosis not present

## 2019-10-14 DIAGNOSIS — Z79818 Long term (current) use of other agents affecting estrogen receptors and estrogen levels: Secondary | ICD-10-CM | POA: Diagnosis not present

## 2019-10-14 DIAGNOSIS — R7989 Other specified abnormal findings of blood chemistry: Secondary | ICD-10-CM | POA: Diagnosis not present

## 2019-11-02 DIAGNOSIS — C7951 Secondary malignant neoplasm of bone: Secondary | ICD-10-CM | POA: Diagnosis not present

## 2019-11-02 DIAGNOSIS — C61 Malignant neoplasm of prostate: Secondary | ICD-10-CM | POA: Diagnosis not present

## 2019-11-02 DIAGNOSIS — R7989 Other specified abnormal findings of blood chemistry: Secondary | ICD-10-CM | POA: Diagnosis not present

## 2019-11-02 DIAGNOSIS — N189 Chronic kidney disease, unspecified: Secondary | ICD-10-CM | POA: Diagnosis not present

## 2019-11-02 DIAGNOSIS — D631 Anemia in chronic kidney disease: Secondary | ICD-10-CM | POA: Diagnosis not present

## 2019-11-03 DIAGNOSIS — R69 Illness, unspecified: Secondary | ICD-10-CM | POA: Diagnosis not present

## 2019-11-09 DIAGNOSIS — C61 Malignant neoplasm of prostate: Secondary | ICD-10-CM | POA: Diagnosis not present

## 2019-11-09 DIAGNOSIS — M1990 Unspecified osteoarthritis, unspecified site: Secondary | ICD-10-CM | POA: Diagnosis not present

## 2019-11-09 DIAGNOSIS — E7800 Pure hypercholesterolemia, unspecified: Secondary | ICD-10-CM | POA: Diagnosis not present

## 2019-11-09 DIAGNOSIS — C7951 Secondary malignant neoplasm of bone: Secondary | ICD-10-CM | POA: Diagnosis not present

## 2019-11-09 DIAGNOSIS — Z79818 Long term (current) use of other agents affecting estrogen receptors and estrogen levels: Secondary | ICD-10-CM | POA: Diagnosis not present

## 2019-11-09 DIAGNOSIS — R7989 Other specified abnormal findings of blood chemistry: Secondary | ICD-10-CM | POA: Diagnosis not present

## 2019-11-09 DIAGNOSIS — Z5111 Encounter for antineoplastic chemotherapy: Secondary | ICD-10-CM | POA: Diagnosis not present

## 2019-11-09 DIAGNOSIS — Z87891 Personal history of nicotine dependence: Secondary | ICD-10-CM | POA: Diagnosis not present

## 2019-12-06 DIAGNOSIS — N189 Chronic kidney disease, unspecified: Secondary | ICD-10-CM | POA: Diagnosis not present

## 2019-12-06 DIAGNOSIS — D631 Anemia in chronic kidney disease: Secondary | ICD-10-CM | POA: Diagnosis not present

## 2019-12-06 DIAGNOSIS — C7951 Secondary malignant neoplasm of bone: Secondary | ICD-10-CM | POA: Diagnosis not present

## 2019-12-06 DIAGNOSIS — C61 Malignant neoplasm of prostate: Secondary | ICD-10-CM | POA: Diagnosis not present

## 2019-12-06 DIAGNOSIS — R7989 Other specified abnormal findings of blood chemistry: Secondary | ICD-10-CM | POA: Diagnosis not present

## 2019-12-06 DIAGNOSIS — E559 Vitamin D deficiency, unspecified: Secondary | ICD-10-CM | POA: Diagnosis not present

## 2019-12-09 DIAGNOSIS — Z87891 Personal history of nicotine dependence: Secondary | ICD-10-CM | POA: Diagnosis not present

## 2019-12-09 DIAGNOSIS — R9721 Rising PSA following treatment for malignant neoplasm of prostate: Secondary | ICD-10-CM | POA: Diagnosis not present

## 2019-12-09 DIAGNOSIS — Z79818 Long term (current) use of other agents affecting estrogen receptors and estrogen levels: Secondary | ICD-10-CM | POA: Diagnosis not present

## 2019-12-09 DIAGNOSIS — D631 Anemia in chronic kidney disease: Secondary | ICD-10-CM | POA: Diagnosis not present

## 2019-12-09 DIAGNOSIS — R972 Elevated prostate specific antigen [PSA]: Secondary | ICD-10-CM | POA: Diagnosis not present

## 2019-12-09 DIAGNOSIS — M1990 Unspecified osteoarthritis, unspecified site: Secondary | ICD-10-CM | POA: Diagnosis not present

## 2019-12-09 DIAGNOSIS — D649 Anemia, unspecified: Secondary | ICD-10-CM | POA: Diagnosis not present

## 2019-12-09 DIAGNOSIS — E7800 Pure hypercholesterolemia, unspecified: Secondary | ICD-10-CM | POA: Diagnosis not present

## 2019-12-09 DIAGNOSIS — C7951 Secondary malignant neoplasm of bone: Secondary | ICD-10-CM | POA: Diagnosis not present

## 2019-12-09 DIAGNOSIS — C61 Malignant neoplasm of prostate: Secondary | ICD-10-CM | POA: Diagnosis not present

## 2019-12-09 DIAGNOSIS — R7989 Other specified abnormal findings of blood chemistry: Secondary | ICD-10-CM | POA: Diagnosis not present

## 2019-12-09 DIAGNOSIS — N189 Chronic kidney disease, unspecified: Secondary | ICD-10-CM | POA: Diagnosis not present

## 2019-12-22 DIAGNOSIS — Z1211 Encounter for screening for malignant neoplasm of colon: Secondary | ICD-10-CM | POA: Diagnosis not present

## 2019-12-22 DIAGNOSIS — Z1339 Encounter for screening examination for other mental health and behavioral disorders: Secondary | ICD-10-CM | POA: Diagnosis not present

## 2019-12-22 DIAGNOSIS — Z1331 Encounter for screening for depression: Secondary | ICD-10-CM | POA: Diagnosis not present

## 2019-12-22 DIAGNOSIS — Z79899 Other long term (current) drug therapy: Secondary | ICD-10-CM | POA: Diagnosis not present

## 2019-12-22 DIAGNOSIS — E7800 Pure hypercholesterolemia, unspecified: Secondary | ICD-10-CM | POA: Diagnosis not present

## 2019-12-22 DIAGNOSIS — Z6824 Body mass index (BMI) 24.0-24.9, adult: Secondary | ICD-10-CM | POA: Diagnosis not present

## 2019-12-22 DIAGNOSIS — C61 Malignant neoplasm of prostate: Secondary | ICD-10-CM | POA: Diagnosis not present

## 2019-12-22 DIAGNOSIS — R5383 Other fatigue: Secondary | ICD-10-CM | POA: Diagnosis not present

## 2019-12-22 DIAGNOSIS — C7951 Secondary malignant neoplasm of bone: Secondary | ICD-10-CM | POA: Diagnosis not present

## 2019-12-22 DIAGNOSIS — Z7189 Other specified counseling: Secondary | ICD-10-CM | POA: Diagnosis not present

## 2019-12-22 DIAGNOSIS — Z0000 Encounter for general adult medical examination without abnormal findings: Secondary | ICD-10-CM | POA: Diagnosis not present

## 2019-12-22 DIAGNOSIS — Z299 Encounter for prophylactic measures, unspecified: Secondary | ICD-10-CM | POA: Diagnosis not present

## 2020-01-07 ENCOUNTER — Ambulatory Visit: Payer: Medicare HMO

## 2020-01-10 DIAGNOSIS — N189 Chronic kidney disease, unspecified: Secondary | ICD-10-CM | POA: Diagnosis not present

## 2020-01-10 DIAGNOSIS — C61 Malignant neoplasm of prostate: Secondary | ICD-10-CM | POA: Diagnosis not present

## 2020-01-10 DIAGNOSIS — E559 Vitamin D deficiency, unspecified: Secondary | ICD-10-CM | POA: Diagnosis not present

## 2020-01-10 DIAGNOSIS — C7951 Secondary malignant neoplasm of bone: Secondary | ICD-10-CM | POA: Diagnosis not present

## 2020-01-10 DIAGNOSIS — D631 Anemia in chronic kidney disease: Secondary | ICD-10-CM | POA: Diagnosis not present

## 2020-01-10 DIAGNOSIS — R7989 Other specified abnormal findings of blood chemistry: Secondary | ICD-10-CM | POA: Diagnosis not present

## 2020-01-12 DIAGNOSIS — R7989 Other specified abnormal findings of blood chemistry: Secondary | ICD-10-CM | POA: Diagnosis not present

## 2020-01-12 DIAGNOSIS — R972 Elevated prostate specific antigen [PSA]: Secondary | ICD-10-CM | POA: Diagnosis not present

## 2020-01-12 DIAGNOSIS — Z79818 Long term (current) use of other agents affecting estrogen receptors and estrogen levels: Secondary | ICD-10-CM | POA: Diagnosis not present

## 2020-01-12 DIAGNOSIS — D630 Anemia in neoplastic disease: Secondary | ICD-10-CM | POA: Diagnosis not present

## 2020-01-12 DIAGNOSIS — C61 Malignant neoplasm of prostate: Secondary | ICD-10-CM | POA: Diagnosis not present

## 2020-01-12 DIAGNOSIS — C7951 Secondary malignant neoplasm of bone: Secondary | ICD-10-CM | POA: Diagnosis not present

## 2020-01-12 DIAGNOSIS — Z87891 Personal history of nicotine dependence: Secondary | ICD-10-CM | POA: Diagnosis not present

## 2020-01-12 DIAGNOSIS — M25551 Pain in right hip: Secondary | ICD-10-CM | POA: Diagnosis not present

## 2020-01-12 DIAGNOSIS — Z79899 Other long term (current) drug therapy: Secondary | ICD-10-CM | POA: Diagnosis not present

## 2020-01-12 DIAGNOSIS — E7800 Pure hypercholesterolemia, unspecified: Secondary | ICD-10-CM | POA: Diagnosis not present

## 2020-01-12 DIAGNOSIS — Z8042 Family history of malignant neoplasm of prostate: Secondary | ICD-10-CM | POA: Diagnosis not present

## 2020-02-01 DIAGNOSIS — C7951 Secondary malignant neoplasm of bone: Secondary | ICD-10-CM | POA: Diagnosis not present

## 2020-02-01 DIAGNOSIS — C61 Malignant neoplasm of prostate: Secondary | ICD-10-CM | POA: Diagnosis not present

## 2020-02-01 DIAGNOSIS — Z5111 Encounter for antineoplastic chemotherapy: Secondary | ICD-10-CM | POA: Diagnosis not present

## 2020-02-07 DIAGNOSIS — R7989 Other specified abnormal findings of blood chemistry: Secondary | ICD-10-CM | POA: Diagnosis not present

## 2020-02-07 DIAGNOSIS — D631 Anemia in chronic kidney disease: Secondary | ICD-10-CM | POA: Diagnosis not present

## 2020-02-07 DIAGNOSIS — C7951 Secondary malignant neoplasm of bone: Secondary | ICD-10-CM | POA: Diagnosis not present

## 2020-02-07 DIAGNOSIS — C61 Malignant neoplasm of prostate: Secondary | ICD-10-CM | POA: Diagnosis not present

## 2020-02-07 DIAGNOSIS — N189 Chronic kidney disease, unspecified: Secondary | ICD-10-CM | POA: Diagnosis not present

## 2020-02-07 DIAGNOSIS — E559 Vitamin D deficiency, unspecified: Secondary | ICD-10-CM | POA: Diagnosis not present

## 2020-02-09 DIAGNOSIS — M1990 Unspecified osteoarthritis, unspecified site: Secondary | ICD-10-CM | POA: Diagnosis not present

## 2020-02-09 DIAGNOSIS — Z79899 Other long term (current) drug therapy: Secondary | ICD-10-CM | POA: Diagnosis not present

## 2020-02-09 DIAGNOSIS — C7951 Secondary malignant neoplasm of bone: Secondary | ICD-10-CM | POA: Diagnosis not present

## 2020-02-09 DIAGNOSIS — Z87891 Personal history of nicotine dependence: Secondary | ICD-10-CM | POA: Diagnosis not present

## 2020-02-09 DIAGNOSIS — D649 Anemia, unspecified: Secondary | ICD-10-CM | POA: Diagnosis not present

## 2020-02-09 DIAGNOSIS — Z79818 Long term (current) use of other agents affecting estrogen receptors and estrogen levels: Secondary | ICD-10-CM | POA: Diagnosis not present

## 2020-02-09 DIAGNOSIS — C61 Malignant neoplasm of prostate: Secondary | ICD-10-CM | POA: Diagnosis not present

## 2020-02-09 DIAGNOSIS — E7800 Pure hypercholesterolemia, unspecified: Secondary | ICD-10-CM | POA: Diagnosis not present

## 2020-02-09 DIAGNOSIS — Z8042 Family history of malignant neoplasm of prostate: Secondary | ICD-10-CM | POA: Diagnosis not present

## 2020-03-06 DIAGNOSIS — N189 Chronic kidney disease, unspecified: Secondary | ICD-10-CM | POA: Diagnosis not present

## 2020-03-06 DIAGNOSIS — E559 Vitamin D deficiency, unspecified: Secondary | ICD-10-CM | POA: Diagnosis not present

## 2020-03-06 DIAGNOSIS — D631 Anemia in chronic kidney disease: Secondary | ICD-10-CM | POA: Diagnosis not present

## 2020-03-06 DIAGNOSIS — R7989 Other specified abnormal findings of blood chemistry: Secondary | ICD-10-CM | POA: Diagnosis not present

## 2020-03-06 DIAGNOSIS — C7951 Secondary malignant neoplasm of bone: Secondary | ICD-10-CM | POA: Diagnosis not present

## 2020-03-06 DIAGNOSIS — C61 Malignant neoplasm of prostate: Secondary | ICD-10-CM | POA: Diagnosis not present

## 2020-03-08 DIAGNOSIS — C61 Malignant neoplasm of prostate: Secondary | ICD-10-CM | POA: Diagnosis not present

## 2020-03-08 DIAGNOSIS — R7989 Other specified abnormal findings of blood chemistry: Secondary | ICD-10-CM | POA: Diagnosis not present

## 2020-03-08 DIAGNOSIS — Z5112 Encounter for antineoplastic immunotherapy: Secondary | ICD-10-CM | POA: Diagnosis not present

## 2020-03-08 DIAGNOSIS — Z79818 Long term (current) use of other agents affecting estrogen receptors and estrogen levels: Secondary | ICD-10-CM | POA: Diagnosis not present

## 2020-03-08 DIAGNOSIS — C7951 Secondary malignant neoplasm of bone: Secondary | ICD-10-CM | POA: Diagnosis not present

## 2020-03-08 DIAGNOSIS — Z8042 Family history of malignant neoplasm of prostate: Secondary | ICD-10-CM | POA: Diagnosis not present

## 2020-03-08 DIAGNOSIS — R972 Elevated prostate specific antigen [PSA]: Secondary | ICD-10-CM | POA: Diagnosis not present

## 2020-04-03 DIAGNOSIS — C61 Malignant neoplasm of prostate: Secondary | ICD-10-CM | POA: Diagnosis not present

## 2020-04-03 DIAGNOSIS — R972 Elevated prostate specific antigen [PSA]: Secondary | ICD-10-CM | POA: Diagnosis not present

## 2020-04-03 DIAGNOSIS — R7989 Other specified abnormal findings of blood chemistry: Secondary | ICD-10-CM | POA: Diagnosis not present

## 2020-04-03 DIAGNOSIS — C7951 Secondary malignant neoplasm of bone: Secondary | ICD-10-CM | POA: Diagnosis not present

## 2020-04-06 DIAGNOSIS — Z1589 Genetic susceptibility to other disease: Secondary | ICD-10-CM | POA: Diagnosis not present

## 2020-04-06 DIAGNOSIS — E7800 Pure hypercholesterolemia, unspecified: Secondary | ICD-10-CM | POA: Diagnosis not present

## 2020-04-06 DIAGNOSIS — Z87891 Personal history of nicotine dependence: Secondary | ICD-10-CM | POA: Diagnosis not present

## 2020-04-06 DIAGNOSIS — Z79818 Long term (current) use of other agents affecting estrogen receptors and estrogen levels: Secondary | ICD-10-CM | POA: Diagnosis not present

## 2020-04-06 DIAGNOSIS — Z1501 Genetic susceptibility to malignant neoplasm of breast: Secondary | ICD-10-CM | POA: Diagnosis not present

## 2020-04-06 DIAGNOSIS — Z8042 Family history of malignant neoplasm of prostate: Secondary | ICD-10-CM | POA: Diagnosis not present

## 2020-04-06 DIAGNOSIS — C61 Malignant neoplasm of prostate: Secondary | ICD-10-CM | POA: Diagnosis not present

## 2020-04-06 DIAGNOSIS — D649 Anemia, unspecified: Secondary | ICD-10-CM | POA: Diagnosis not present

## 2020-04-06 DIAGNOSIS — C7951 Secondary malignant neoplasm of bone: Secondary | ICD-10-CM | POA: Diagnosis not present

## 2020-04-06 DIAGNOSIS — M1990 Unspecified osteoarthritis, unspecified site: Secondary | ICD-10-CM | POA: Diagnosis not present

## 2020-04-06 DIAGNOSIS — M25551 Pain in right hip: Secondary | ICD-10-CM | POA: Diagnosis not present

## 2020-04-06 DIAGNOSIS — R9721 Rising PSA following treatment for malignant neoplasm of prostate: Secondary | ICD-10-CM | POA: Diagnosis not present

## 2020-04-17 DIAGNOSIS — C7951 Secondary malignant neoplasm of bone: Secondary | ICD-10-CM | POA: Diagnosis not present

## 2020-04-17 DIAGNOSIS — Z299 Encounter for prophylactic measures, unspecified: Secondary | ICD-10-CM | POA: Diagnosis not present

## 2020-04-17 DIAGNOSIS — E7800 Pure hypercholesterolemia, unspecified: Secondary | ICD-10-CM | POA: Diagnosis not present

## 2020-04-17 DIAGNOSIS — I10 Essential (primary) hypertension: Secondary | ICD-10-CM | POA: Diagnosis not present

## 2020-04-17 DIAGNOSIS — R232 Flushing: Secondary | ICD-10-CM | POA: Diagnosis not present

## 2020-05-01 DIAGNOSIS — N189 Chronic kidney disease, unspecified: Secondary | ICD-10-CM | POA: Diagnosis not present

## 2020-05-01 DIAGNOSIS — D631 Anemia in chronic kidney disease: Secondary | ICD-10-CM | POA: Diagnosis not present

## 2020-05-01 DIAGNOSIS — C7951 Secondary malignant neoplasm of bone: Secondary | ICD-10-CM | POA: Diagnosis not present

## 2020-05-01 DIAGNOSIS — R7989 Other specified abnormal findings of blood chemistry: Secondary | ICD-10-CM | POA: Diagnosis not present

## 2020-05-01 DIAGNOSIS — C61 Malignant neoplasm of prostate: Secondary | ICD-10-CM | POA: Diagnosis not present

## 2020-05-01 DIAGNOSIS — R972 Elevated prostate specific antigen [PSA]: Secondary | ICD-10-CM | POA: Diagnosis not present

## 2020-05-03 DIAGNOSIS — Z85828 Personal history of other malignant neoplasm of skin: Secondary | ICD-10-CM | POA: Diagnosis not present

## 2020-05-03 DIAGNOSIS — L821 Other seborrheic keratosis: Secondary | ICD-10-CM | POA: Diagnosis not present

## 2020-05-03 DIAGNOSIS — R69 Illness, unspecified: Secondary | ICD-10-CM | POA: Diagnosis not present

## 2020-05-03 DIAGNOSIS — L814 Other melanin hyperpigmentation: Secondary | ICD-10-CM | POA: Diagnosis not present

## 2020-05-03 DIAGNOSIS — L570 Actinic keratosis: Secondary | ICD-10-CM | POA: Diagnosis not present

## 2020-05-04 DIAGNOSIS — Z5111 Encounter for antineoplastic chemotherapy: Secondary | ICD-10-CM | POA: Diagnosis not present

## 2020-05-04 DIAGNOSIS — C7951 Secondary malignant neoplasm of bone: Secondary | ICD-10-CM | POA: Diagnosis not present

## 2020-05-04 DIAGNOSIS — C61 Malignant neoplasm of prostate: Secondary | ICD-10-CM | POA: Diagnosis not present

## 2020-05-09 DIAGNOSIS — R69 Illness, unspecified: Secondary | ICD-10-CM | POA: Diagnosis not present

## 2020-05-24 DIAGNOSIS — I10 Essential (primary) hypertension: Secondary | ICD-10-CM | POA: Diagnosis not present

## 2020-06-05 DIAGNOSIS — C7951 Secondary malignant neoplasm of bone: Secondary | ICD-10-CM | POA: Diagnosis not present

## 2020-06-05 DIAGNOSIS — C61 Malignant neoplasm of prostate: Secondary | ICD-10-CM | POA: Diagnosis not present

## 2020-06-05 DIAGNOSIS — D631 Anemia in chronic kidney disease: Secondary | ICD-10-CM | POA: Diagnosis not present

## 2020-06-05 DIAGNOSIS — R972 Elevated prostate specific antigen [PSA]: Secondary | ICD-10-CM | POA: Diagnosis not present

## 2020-06-05 DIAGNOSIS — N189 Chronic kidney disease, unspecified: Secondary | ICD-10-CM | POA: Diagnosis not present

## 2020-06-05 DIAGNOSIS — R7989 Other specified abnormal findings of blood chemistry: Secondary | ICD-10-CM | POA: Diagnosis not present

## 2020-06-08 DIAGNOSIS — Z1589 Genetic susceptibility to other disease: Secondary | ICD-10-CM | POA: Diagnosis not present

## 2020-06-08 DIAGNOSIS — M25551 Pain in right hip: Secondary | ICD-10-CM | POA: Diagnosis not present

## 2020-06-08 DIAGNOSIS — Z1501 Genetic susceptibility to malignant neoplasm of breast: Secondary | ICD-10-CM | POA: Diagnosis not present

## 2020-06-08 DIAGNOSIS — E7800 Pure hypercholesterolemia, unspecified: Secondary | ICD-10-CM | POA: Diagnosis not present

## 2020-06-08 DIAGNOSIS — M1990 Unspecified osteoarthritis, unspecified site: Secondary | ICD-10-CM | POA: Diagnosis not present

## 2020-06-08 DIAGNOSIS — R9721 Rising PSA following treatment for malignant neoplasm of prostate: Secondary | ICD-10-CM | POA: Diagnosis not present

## 2020-06-08 DIAGNOSIS — Z8042 Family history of malignant neoplasm of prostate: Secondary | ICD-10-CM | POA: Diagnosis not present

## 2020-06-08 DIAGNOSIS — Z79818 Long term (current) use of other agents affecting estrogen receptors and estrogen levels: Secondary | ICD-10-CM | POA: Diagnosis not present

## 2020-06-08 DIAGNOSIS — D649 Anemia, unspecified: Secondary | ICD-10-CM | POA: Diagnosis not present

## 2020-06-08 DIAGNOSIS — R7989 Other specified abnormal findings of blood chemistry: Secondary | ICD-10-CM | POA: Diagnosis not present

## 2020-06-08 DIAGNOSIS — C7951 Secondary malignant neoplasm of bone: Secondary | ICD-10-CM | POA: Diagnosis not present

## 2020-06-08 DIAGNOSIS — C61 Malignant neoplasm of prostate: Secondary | ICD-10-CM | POA: Diagnosis not present

## 2020-06-23 DIAGNOSIS — I10 Essential (primary) hypertension: Secondary | ICD-10-CM | POA: Diagnosis not present

## 2020-07-04 DIAGNOSIS — D631 Anemia in chronic kidney disease: Secondary | ICD-10-CM | POA: Diagnosis not present

## 2020-07-04 DIAGNOSIS — C61 Malignant neoplasm of prostate: Secondary | ICD-10-CM | POA: Diagnosis not present

## 2020-07-04 DIAGNOSIS — N189 Chronic kidney disease, unspecified: Secondary | ICD-10-CM | POA: Diagnosis not present

## 2020-07-04 DIAGNOSIS — C7951 Secondary malignant neoplasm of bone: Secondary | ICD-10-CM | POA: Diagnosis not present

## 2020-07-07 DIAGNOSIS — N400 Enlarged prostate without lower urinary tract symptoms: Secondary | ICD-10-CM | POA: Diagnosis not present

## 2020-07-07 DIAGNOSIS — R972 Elevated prostate specific antigen [PSA]: Secondary | ICD-10-CM | POA: Diagnosis not present

## 2020-07-07 DIAGNOSIS — Z1589 Genetic susceptibility to other disease: Secondary | ICD-10-CM | POA: Diagnosis not present

## 2020-07-07 DIAGNOSIS — Z09 Encounter for follow-up examination after completed treatment for conditions other than malignant neoplasm: Secondary | ICD-10-CM | POA: Diagnosis not present

## 2020-07-07 DIAGNOSIS — R7989 Other specified abnormal findings of blood chemistry: Secondary | ICD-10-CM | POA: Diagnosis not present

## 2020-07-07 DIAGNOSIS — Z79818 Long term (current) use of other agents affecting estrogen receptors and estrogen levels: Secondary | ICD-10-CM | POA: Diagnosis not present

## 2020-07-07 DIAGNOSIS — C61 Malignant neoplasm of prostate: Secondary | ICD-10-CM | POA: Diagnosis not present

## 2020-07-07 DIAGNOSIS — Z1501 Genetic susceptibility to malignant neoplasm of breast: Secondary | ICD-10-CM | POA: Diagnosis not present

## 2020-07-07 DIAGNOSIS — E7800 Pure hypercholesterolemia, unspecified: Secondary | ICD-10-CM | POA: Diagnosis not present

## 2020-07-07 DIAGNOSIS — D6109 Other constitutional aplastic anemia: Secondary | ICD-10-CM | POA: Diagnosis not present

## 2020-07-07 DIAGNOSIS — C7951 Secondary malignant neoplasm of bone: Secondary | ICD-10-CM | POA: Diagnosis not present

## 2020-07-07 DIAGNOSIS — Z8042 Family history of malignant neoplasm of prostate: Secondary | ICD-10-CM | POA: Diagnosis not present

## 2020-07-07 DIAGNOSIS — Z79899 Other long term (current) drug therapy: Secondary | ICD-10-CM | POA: Diagnosis not present

## 2020-07-17 DIAGNOSIS — I10 Essential (primary) hypertension: Secondary | ICD-10-CM | POA: Diagnosis not present

## 2020-07-17 DIAGNOSIS — Z299 Encounter for prophylactic measures, unspecified: Secondary | ICD-10-CM | POA: Diagnosis not present

## 2020-07-17 DIAGNOSIS — E7800 Pure hypercholesterolemia, unspecified: Secondary | ICD-10-CM | POA: Diagnosis not present

## 2020-07-17 DIAGNOSIS — Z789 Other specified health status: Secondary | ICD-10-CM | POA: Diagnosis not present

## 2020-07-17 DIAGNOSIS — C7951 Secondary malignant neoplasm of bone: Secondary | ICD-10-CM | POA: Diagnosis not present

## 2020-07-17 DIAGNOSIS — C61 Malignant neoplasm of prostate: Secondary | ICD-10-CM | POA: Diagnosis not present

## 2020-07-25 DIAGNOSIS — I10 Essential (primary) hypertension: Secondary | ICD-10-CM | POA: Diagnosis not present

## 2020-08-01 DIAGNOSIS — D631 Anemia in chronic kidney disease: Secondary | ICD-10-CM | POA: Diagnosis not present

## 2020-08-01 DIAGNOSIS — C61 Malignant neoplasm of prostate: Secondary | ICD-10-CM | POA: Diagnosis not present

## 2020-08-01 DIAGNOSIS — Z79818 Long term (current) use of other agents affecting estrogen receptors and estrogen levels: Secondary | ICD-10-CM | POA: Diagnosis not present

## 2020-08-01 DIAGNOSIS — C7951 Secondary malignant neoplasm of bone: Secondary | ICD-10-CM | POA: Diagnosis not present

## 2020-08-01 DIAGNOSIS — Z1589 Genetic susceptibility to other disease: Secondary | ICD-10-CM | POA: Diagnosis not present

## 2020-08-01 DIAGNOSIS — R972 Elevated prostate specific antigen [PSA]: Secondary | ICD-10-CM | POA: Diagnosis not present

## 2020-08-01 DIAGNOSIS — Z1501 Genetic susceptibility to malignant neoplasm of breast: Secondary | ICD-10-CM | POA: Diagnosis not present

## 2020-08-01 DIAGNOSIS — N189 Chronic kidney disease, unspecified: Secondary | ICD-10-CM | POA: Diagnosis not present

## 2020-08-01 DIAGNOSIS — R7989 Other specified abnormal findings of blood chemistry: Secondary | ICD-10-CM | POA: Diagnosis not present

## 2020-08-01 DIAGNOSIS — Z09 Encounter for follow-up examination after completed treatment for conditions other than malignant neoplasm: Secondary | ICD-10-CM | POA: Diagnosis not present

## 2020-08-02 DIAGNOSIS — Z1501 Genetic susceptibility to malignant neoplasm of breast: Secondary | ICD-10-CM | POA: Diagnosis not present

## 2020-08-02 DIAGNOSIS — R972 Elevated prostate specific antigen [PSA]: Secondary | ICD-10-CM | POA: Diagnosis not present

## 2020-08-02 DIAGNOSIS — C7951 Secondary malignant neoplasm of bone: Secondary | ICD-10-CM | POA: Diagnosis not present

## 2020-08-02 DIAGNOSIS — N189 Chronic kidney disease, unspecified: Secondary | ICD-10-CM | POA: Diagnosis not present

## 2020-08-02 DIAGNOSIS — Z09 Encounter for follow-up examination after completed treatment for conditions other than malignant neoplasm: Secondary | ICD-10-CM | POA: Diagnosis not present

## 2020-08-02 DIAGNOSIS — C61 Malignant neoplasm of prostate: Secondary | ICD-10-CM | POA: Diagnosis not present

## 2020-08-02 DIAGNOSIS — D631 Anemia in chronic kidney disease: Secondary | ICD-10-CM | POA: Diagnosis not present

## 2020-08-02 DIAGNOSIS — Z8042 Family history of malignant neoplasm of prostate: Secondary | ICD-10-CM | POA: Diagnosis not present

## 2020-08-02 DIAGNOSIS — Z1589 Genetic susceptibility to other disease: Secondary | ICD-10-CM | POA: Diagnosis not present

## 2020-08-02 DIAGNOSIS — Z79818 Long term (current) use of other agents affecting estrogen receptors and estrogen levels: Secondary | ICD-10-CM | POA: Diagnosis not present

## 2020-08-02 DIAGNOSIS — R7989 Other specified abnormal findings of blood chemistry: Secondary | ICD-10-CM | POA: Diagnosis not present

## 2020-08-07 DIAGNOSIS — C7951 Secondary malignant neoplasm of bone: Secondary | ICD-10-CM | POA: Diagnosis not present

## 2020-08-07 DIAGNOSIS — C61 Malignant neoplasm of prostate: Secondary | ICD-10-CM | POA: Diagnosis not present

## 2020-08-21 DIAGNOSIS — R69 Illness, unspecified: Secondary | ICD-10-CM | POA: Diagnosis not present

## 2020-08-29 DIAGNOSIS — R7989 Other specified abnormal findings of blood chemistry: Secondary | ICD-10-CM | POA: Diagnosis not present

## 2020-08-29 DIAGNOSIS — C7951 Secondary malignant neoplasm of bone: Secondary | ICD-10-CM | POA: Diagnosis not present

## 2020-08-29 DIAGNOSIS — Z09 Encounter for follow-up examination after completed treatment for conditions other than malignant neoplasm: Secondary | ICD-10-CM | POA: Diagnosis not present

## 2020-08-29 DIAGNOSIS — D508 Other iron deficiency anemias: Secondary | ICD-10-CM | POA: Diagnosis not present

## 2020-08-29 DIAGNOSIS — C61 Malignant neoplasm of prostate: Secondary | ICD-10-CM | POA: Diagnosis not present

## 2020-09-04 DIAGNOSIS — C7951 Secondary malignant neoplasm of bone: Secondary | ICD-10-CM | POA: Diagnosis not present

## 2020-09-04 DIAGNOSIS — Z1589 Genetic susceptibility to other disease: Secondary | ICD-10-CM | POA: Diagnosis not present

## 2020-09-04 DIAGNOSIS — Z09 Encounter for follow-up examination after completed treatment for conditions other than malignant neoplasm: Secondary | ICD-10-CM | POA: Diagnosis not present

## 2020-09-04 DIAGNOSIS — Z8042 Family history of malignant neoplasm of prostate: Secondary | ICD-10-CM | POA: Diagnosis not present

## 2020-09-04 DIAGNOSIS — Z79818 Long term (current) use of other agents affecting estrogen receptors and estrogen levels: Secondary | ICD-10-CM | POA: Diagnosis not present

## 2020-09-04 DIAGNOSIS — Z1501 Genetic susceptibility to malignant neoplasm of breast: Secondary | ICD-10-CM | POA: Diagnosis not present

## 2020-09-04 DIAGNOSIS — C61 Malignant neoplasm of prostate: Secondary | ICD-10-CM | POA: Diagnosis not present

## 2020-09-13 IMAGING — US US EXTREM UP*L* COMP
1 series · 13 of 23 positions shown · non-contrast
Comparison: None.

CLINICAL DATA: Soft tissue mass at the volar aspect of the base of
the fifth finger.

EXAM:
ULTRASOUND LEFT UPPER EXTREMITY COMPLETE
TECHNIQUE: Ultrasound examination was performed including evaluation of the
muscles, tendons, joint, and adjacent soft tissues.

[Series 1: us extrem up*left* comp · 0.06mm/px · 23 acquisitions, 13 frames shown]
[im 1/23]
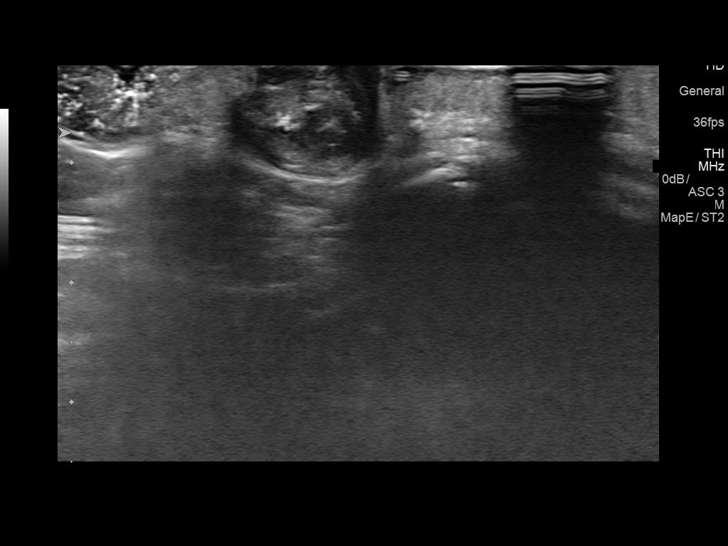
[im 3/23]
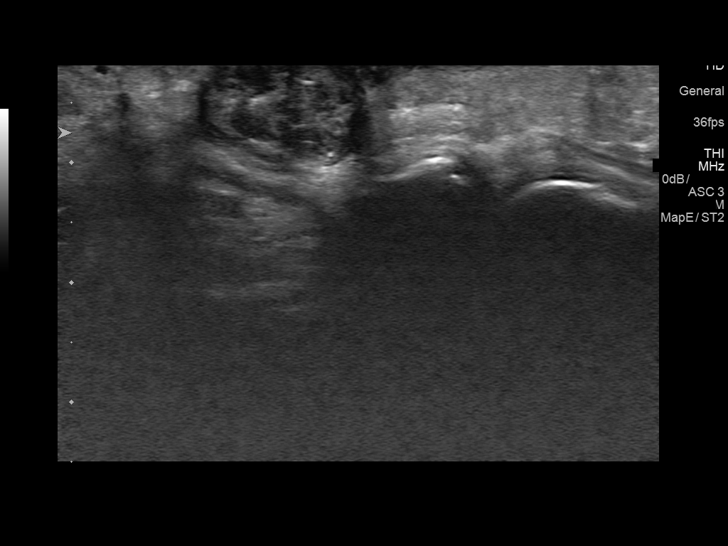
[im 5/23]
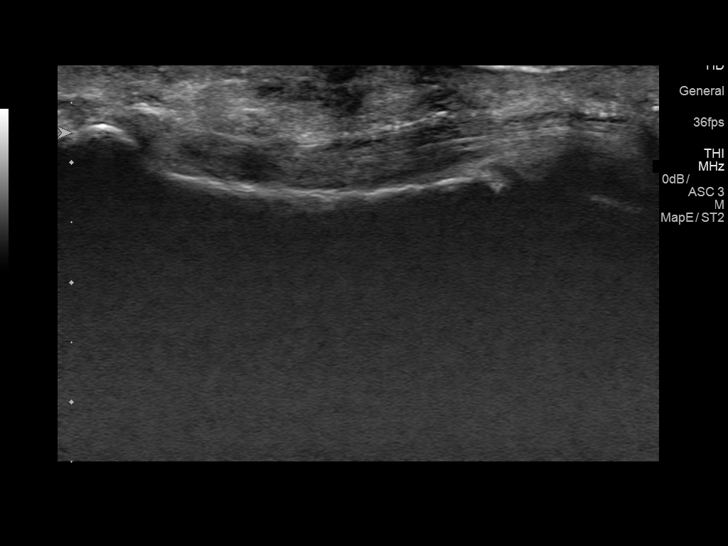
[im 7/23]
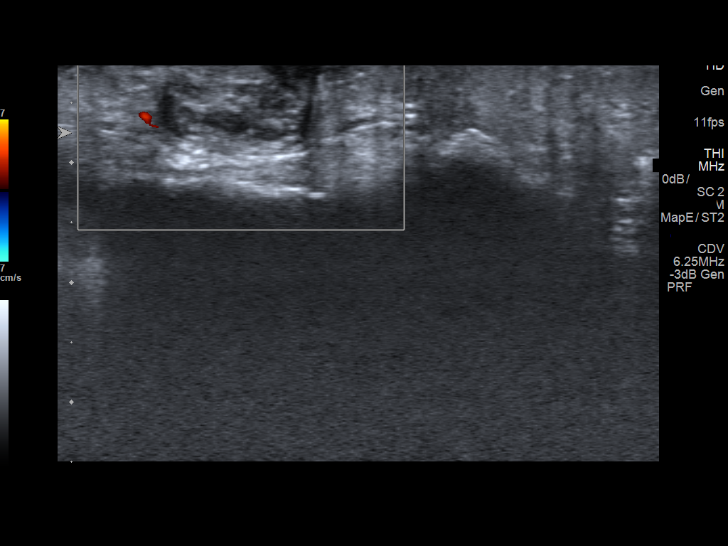
[im 8/23]
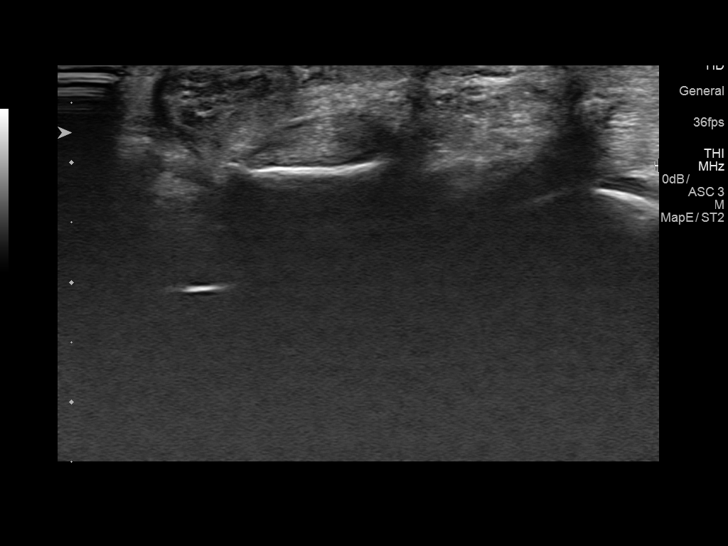
[im 10/23]
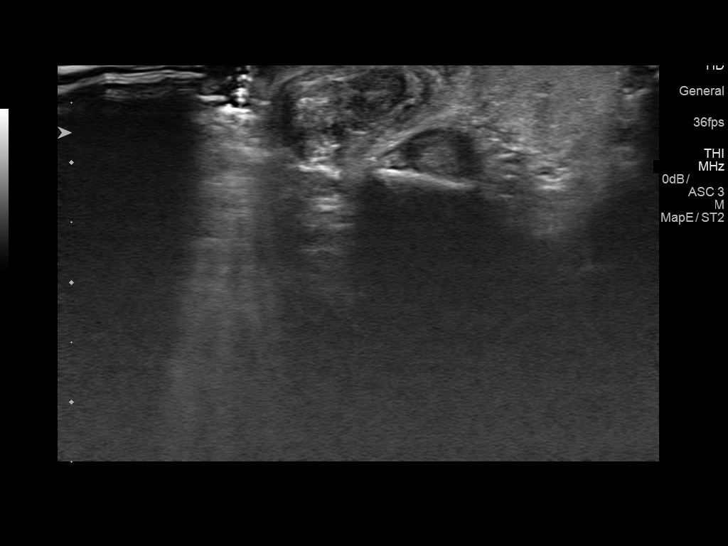
[im 12/23]
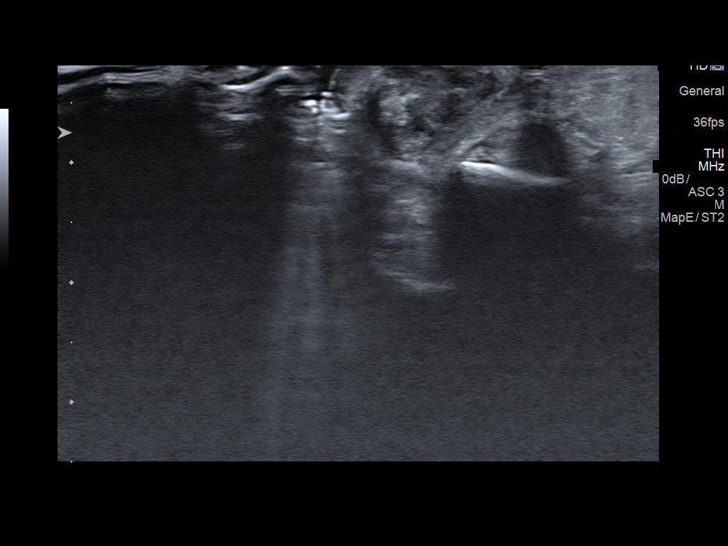
[im 14/23]
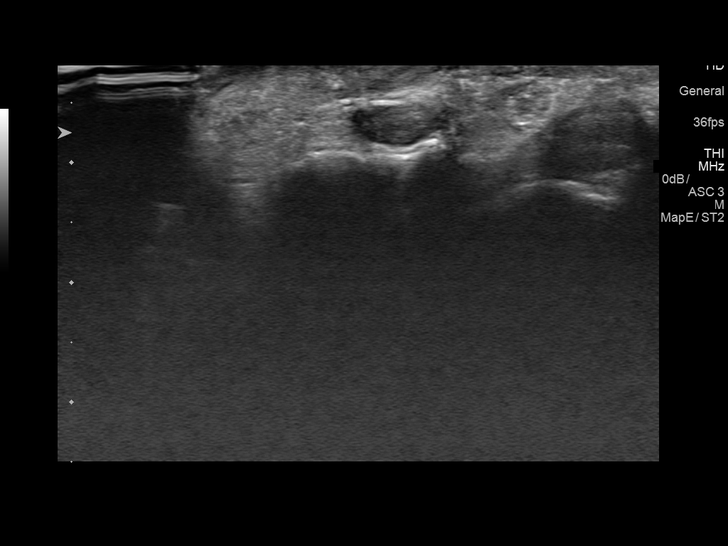
[im 16/23]
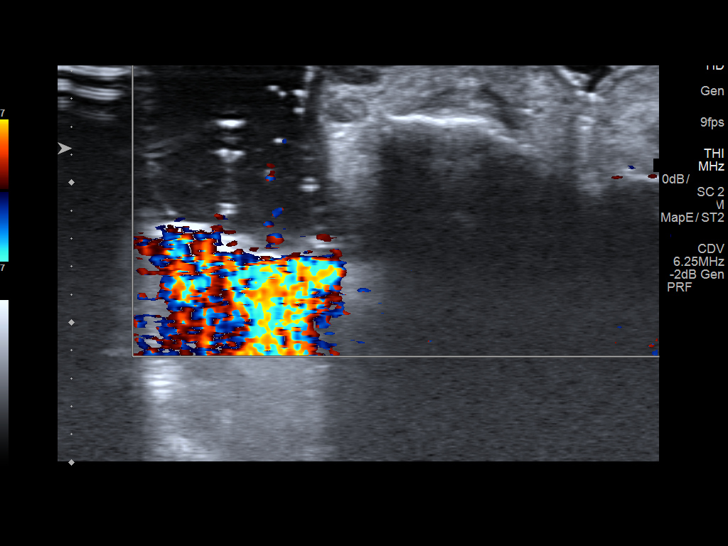
[im 17/23]
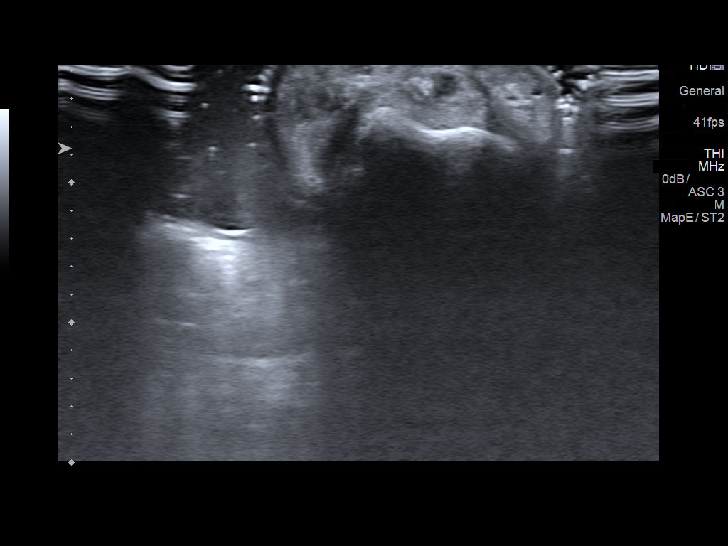
[im 19/23]
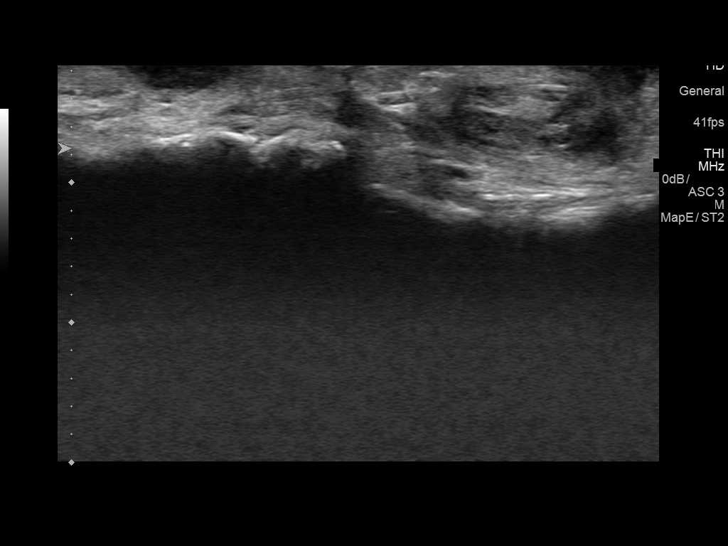
[im 21/23]
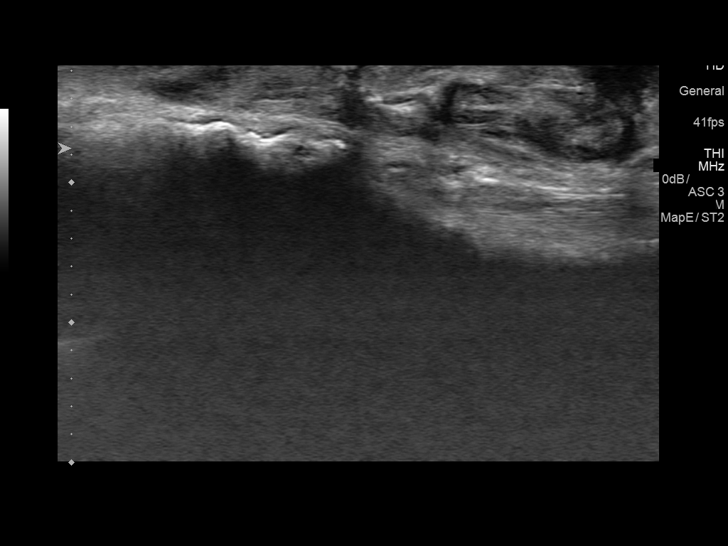
[im 23/23]
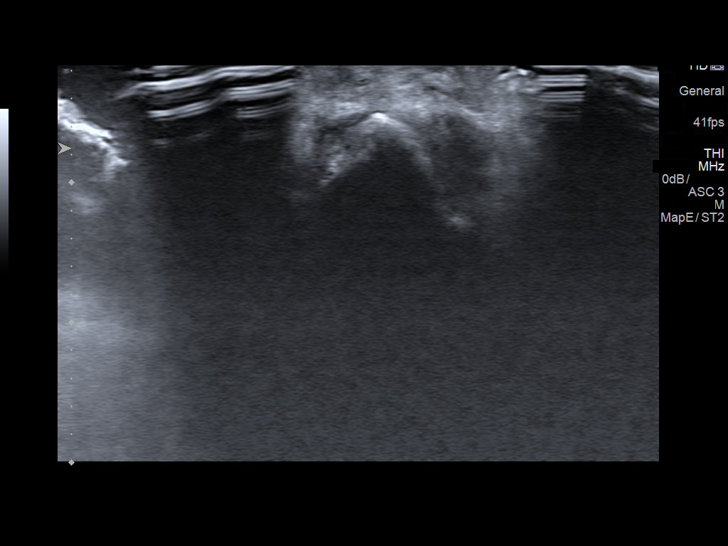

[13 of 23 positions shown; findings below may reference images not displayed]

FINDINGS: Joint Space: There are no effusions in the fifth MCP joint or in the
p.m. IP PIP joint of the fifth digit.

Tendons: The flexor tendons of the fifth digit are normal.

Other Soft Tissue Structures: The dominant soft tissue mass at the
base of the fifth digit measures 15 x 10 x 13 mm. This is a complex
mass, primarily solid but with areas of hypoechogenicity consistent
with some fluid. There is no vascularity within the mass.

There is a second component to the mass which is more distal in this
component is lobulated. There appears to be a tiny communication
between the 2 components with an overall length of the mass being 36
mm with the more distal lobulated component measuring approximately
5 mm in diameter. This component extends along the ulnar aspect of
the finger distal to the PIP joint.

The masses do not appear to be associated with the joints or with
the flexor tendon.

The communication between the 2 components is best demonstrated on
the transverse video with 218 images.
IMPRESSION: Complex inhomogeneous soft tissue mass of the volar aspect of the
right lower finger, most likely representing a complex ganglion
cyst. The mass is bilobed with a dominant mass at the level of the
proximal phalanx and the smaller component adjacent to the middle
phalanx.

## 2020-09-28 DIAGNOSIS — Z1589 Genetic susceptibility to other disease: Secondary | ICD-10-CM | POA: Diagnosis not present

## 2020-09-28 DIAGNOSIS — C61 Malignant neoplasm of prostate: Secondary | ICD-10-CM | POA: Diagnosis not present

## 2020-09-28 DIAGNOSIS — D508 Other iron deficiency anemias: Secondary | ICD-10-CM | POA: Diagnosis not present

## 2020-09-28 DIAGNOSIS — Z09 Encounter for follow-up examination after completed treatment for conditions other than malignant neoplasm: Secondary | ICD-10-CM | POA: Diagnosis not present

## 2020-09-28 DIAGNOSIS — R7989 Other specified abnormal findings of blood chemistry: Secondary | ICD-10-CM | POA: Diagnosis not present

## 2020-09-28 DIAGNOSIS — C7951 Secondary malignant neoplasm of bone: Secondary | ICD-10-CM | POA: Diagnosis not present

## 2020-09-28 DIAGNOSIS — Z8042 Family history of malignant neoplasm of prostate: Secondary | ICD-10-CM | POA: Diagnosis not present

## 2020-09-28 DIAGNOSIS — Z1501 Genetic susceptibility to malignant neoplasm of breast: Secondary | ICD-10-CM | POA: Diagnosis not present

## 2020-10-02 DIAGNOSIS — Z09 Encounter for follow-up examination after completed treatment for conditions other than malignant neoplasm: Secondary | ICD-10-CM | POA: Diagnosis not present

## 2020-10-02 DIAGNOSIS — R948 Abnormal results of function studies of other organs and systems: Secondary | ICD-10-CM | POA: Diagnosis not present

## 2020-10-02 DIAGNOSIS — C7951 Secondary malignant neoplasm of bone: Secondary | ICD-10-CM | POA: Diagnosis not present

## 2020-10-02 DIAGNOSIS — C61 Malignant neoplasm of prostate: Secondary | ICD-10-CM | POA: Diagnosis not present

## 2020-10-02 DIAGNOSIS — Z1501 Genetic susceptibility to malignant neoplasm of breast: Secondary | ICD-10-CM | POA: Diagnosis not present

## 2020-10-02 DIAGNOSIS — Z79818 Long term (current) use of other agents affecting estrogen receptors and estrogen levels: Secondary | ICD-10-CM | POA: Diagnosis not present

## 2020-10-02 DIAGNOSIS — Z1589 Genetic susceptibility to other disease: Secondary | ICD-10-CM | POA: Diagnosis not present

## 2020-10-24 DIAGNOSIS — C61 Malignant neoplasm of prostate: Secondary | ICD-10-CM | POA: Diagnosis not present

## 2020-10-24 DIAGNOSIS — Z09 Encounter for follow-up examination after completed treatment for conditions other than malignant neoplasm: Secondary | ICD-10-CM | POA: Diagnosis not present

## 2020-10-24 DIAGNOSIS — C7951 Secondary malignant neoplasm of bone: Secondary | ICD-10-CM | POA: Diagnosis not present

## 2020-10-30 DIAGNOSIS — M25551 Pain in right hip: Secondary | ICD-10-CM | POA: Diagnosis not present

## 2020-10-30 DIAGNOSIS — R9721 Rising PSA following treatment for malignant neoplasm of prostate: Secondary | ICD-10-CM | POA: Diagnosis not present

## 2020-10-30 DIAGNOSIS — Z79818 Long term (current) use of other agents affecting estrogen receptors and estrogen levels: Secondary | ICD-10-CM | POA: Diagnosis not present

## 2020-10-30 DIAGNOSIS — Z5111 Encounter for antineoplastic chemotherapy: Secondary | ICD-10-CM | POA: Diagnosis not present

## 2020-10-30 DIAGNOSIS — R948 Abnormal results of function studies of other organs and systems: Secondary | ICD-10-CM | POA: Diagnosis not present

## 2020-10-30 DIAGNOSIS — R69 Illness, unspecified: Secondary | ICD-10-CM | POA: Diagnosis not present

## 2020-10-30 DIAGNOSIS — Z09 Encounter for follow-up examination after completed treatment for conditions other than malignant neoplasm: Secondary | ICD-10-CM | POA: Diagnosis not present

## 2020-10-30 DIAGNOSIS — Z8042 Family history of malignant neoplasm of prostate: Secondary | ICD-10-CM | POA: Diagnosis not present

## 2020-10-30 DIAGNOSIS — D649 Anemia, unspecified: Secondary | ICD-10-CM | POA: Diagnosis not present

## 2020-10-30 DIAGNOSIS — E7800 Pure hypercholesterolemia, unspecified: Secondary | ICD-10-CM | POA: Diagnosis not present

## 2020-10-30 DIAGNOSIS — Z87891 Personal history of nicotine dependence: Secondary | ICD-10-CM | POA: Diagnosis not present

## 2020-10-30 DIAGNOSIS — C7951 Secondary malignant neoplasm of bone: Secondary | ICD-10-CM | POA: Diagnosis not present

## 2020-10-30 DIAGNOSIS — M1990 Unspecified osteoarthritis, unspecified site: Secondary | ICD-10-CM | POA: Diagnosis not present

## 2020-10-30 DIAGNOSIS — C61 Malignant neoplasm of prostate: Secondary | ICD-10-CM | POA: Diagnosis not present

## 2020-11-20 DIAGNOSIS — R69 Illness, unspecified: Secondary | ICD-10-CM | POA: Diagnosis not present

## 2020-11-23 DIAGNOSIS — R972 Elevated prostate specific antigen [PSA]: Secondary | ICD-10-CM | POA: Diagnosis not present

## 2020-11-23 DIAGNOSIS — D508 Other iron deficiency anemias: Secondary | ICD-10-CM | POA: Diagnosis not present

## 2020-11-23 DIAGNOSIS — R7989 Other specified abnormal findings of blood chemistry: Secondary | ICD-10-CM | POA: Diagnosis not present

## 2020-11-23 DIAGNOSIS — Z09 Encounter for follow-up examination after completed treatment for conditions other than malignant neoplasm: Secondary | ICD-10-CM | POA: Diagnosis not present

## 2020-11-23 DIAGNOSIS — Z1501 Genetic susceptibility to malignant neoplasm of breast: Secondary | ICD-10-CM | POA: Diagnosis not present

## 2020-11-23 DIAGNOSIS — C7951 Secondary malignant neoplasm of bone: Secondary | ICD-10-CM | POA: Diagnosis not present

## 2020-11-23 DIAGNOSIS — C61 Malignant neoplasm of prostate: Secondary | ICD-10-CM | POA: Diagnosis not present

## 2020-11-23 DIAGNOSIS — Z1589 Genetic susceptibility to other disease: Secondary | ICD-10-CM | POA: Diagnosis not present

## 2020-11-27 DIAGNOSIS — Z79818 Long term (current) use of other agents affecting estrogen receptors and estrogen levels: Secondary | ICD-10-CM | POA: Diagnosis not present

## 2020-11-27 DIAGNOSIS — Z09 Encounter for follow-up examination after completed treatment for conditions other than malignant neoplasm: Secondary | ICD-10-CM | POA: Diagnosis not present

## 2020-11-27 DIAGNOSIS — Z87891 Personal history of nicotine dependence: Secondary | ICD-10-CM | POA: Diagnosis not present

## 2020-11-27 DIAGNOSIS — C7951 Secondary malignant neoplasm of bone: Secondary | ICD-10-CM | POA: Diagnosis not present

## 2020-11-27 DIAGNOSIS — Z79899 Other long term (current) drug therapy: Secondary | ICD-10-CM | POA: Diagnosis not present

## 2020-11-27 DIAGNOSIS — R7989 Other specified abnormal findings of blood chemistry: Secondary | ICD-10-CM | POA: Diagnosis not present

## 2020-11-27 DIAGNOSIS — N400 Enlarged prostate without lower urinary tract symptoms: Secondary | ICD-10-CM | POA: Diagnosis not present

## 2020-11-27 DIAGNOSIS — M1990 Unspecified osteoarthritis, unspecified site: Secondary | ICD-10-CM | POA: Diagnosis not present

## 2020-11-27 DIAGNOSIS — E7800 Pure hypercholesterolemia, unspecified: Secondary | ICD-10-CM | POA: Diagnosis not present

## 2020-11-27 DIAGNOSIS — Z8042 Family history of malignant neoplasm of prostate: Secondary | ICD-10-CM | POA: Diagnosis not present

## 2020-11-27 DIAGNOSIS — C61 Malignant neoplasm of prostate: Secondary | ICD-10-CM | POA: Diagnosis not present

## 2020-11-27 DIAGNOSIS — Z1589 Genetic susceptibility to other disease: Secondary | ICD-10-CM | POA: Diagnosis not present

## 2020-11-27 DIAGNOSIS — Z1501 Genetic susceptibility to malignant neoplasm of breast: Secondary | ICD-10-CM | POA: Diagnosis not present

## 2020-12-22 DIAGNOSIS — Z1589 Genetic susceptibility to other disease: Secondary | ICD-10-CM | POA: Diagnosis not present

## 2020-12-22 DIAGNOSIS — C61 Malignant neoplasm of prostate: Secondary | ICD-10-CM | POA: Diagnosis not present

## 2020-12-22 DIAGNOSIS — Z09 Encounter for follow-up examination after completed treatment for conditions other than malignant neoplasm: Secondary | ICD-10-CM | POA: Diagnosis not present

## 2020-12-22 DIAGNOSIS — D508 Other iron deficiency anemias: Secondary | ICD-10-CM | POA: Diagnosis not present

## 2020-12-22 DIAGNOSIS — Z1501 Genetic susceptibility to malignant neoplasm of breast: Secondary | ICD-10-CM | POA: Diagnosis not present

## 2020-12-22 DIAGNOSIS — C7951 Secondary malignant neoplasm of bone: Secondary | ICD-10-CM | POA: Diagnosis not present

## 2020-12-22 DIAGNOSIS — R972 Elevated prostate specific antigen [PSA]: Secondary | ICD-10-CM | POA: Diagnosis not present

## 2020-12-25 DIAGNOSIS — Z8042 Family history of malignant neoplasm of prostate: Secondary | ICD-10-CM | POA: Diagnosis not present

## 2020-12-25 DIAGNOSIS — C61 Malignant neoplasm of prostate: Secondary | ICD-10-CM | POA: Diagnosis not present

## 2020-12-25 DIAGNOSIS — D631 Anemia in chronic kidney disease: Secondary | ICD-10-CM | POA: Diagnosis not present

## 2020-12-25 DIAGNOSIS — Z87891 Personal history of nicotine dependence: Secondary | ICD-10-CM | POA: Diagnosis not present

## 2020-12-25 DIAGNOSIS — Z1589 Genetic susceptibility to other disease: Secondary | ICD-10-CM | POA: Diagnosis not present

## 2020-12-25 DIAGNOSIS — D649 Anemia, unspecified: Secondary | ICD-10-CM | POA: Diagnosis not present

## 2020-12-25 DIAGNOSIS — Z1501 Genetic susceptibility to malignant neoplasm of breast: Secondary | ICD-10-CM | POA: Diagnosis not present

## 2020-12-25 DIAGNOSIS — N189 Chronic kidney disease, unspecified: Secondary | ICD-10-CM | POA: Diagnosis not present

## 2020-12-25 DIAGNOSIS — Z79818 Long term (current) use of other agents affecting estrogen receptors and estrogen levels: Secondary | ICD-10-CM | POA: Diagnosis not present

## 2020-12-25 DIAGNOSIS — C7951 Secondary malignant neoplasm of bone: Secondary | ICD-10-CM | POA: Diagnosis not present

## 2020-12-25 DIAGNOSIS — E7800 Pure hypercholesterolemia, unspecified: Secondary | ICD-10-CM | POA: Diagnosis not present

## 2020-12-25 DIAGNOSIS — R7989 Other specified abnormal findings of blood chemistry: Secondary | ICD-10-CM | POA: Diagnosis not present

## 2020-12-25 DIAGNOSIS — Z09 Encounter for follow-up examination after completed treatment for conditions other than malignant neoplasm: Secondary | ICD-10-CM | POA: Diagnosis not present

## 2020-12-27 DIAGNOSIS — Z1339 Encounter for screening examination for other mental health and behavioral disorders: Secondary | ICD-10-CM | POA: Diagnosis not present

## 2020-12-27 DIAGNOSIS — Z299 Encounter for prophylactic measures, unspecified: Secondary | ICD-10-CM | POA: Diagnosis not present

## 2020-12-27 DIAGNOSIS — Z7189 Other specified counseling: Secondary | ICD-10-CM | POA: Diagnosis not present

## 2020-12-27 DIAGNOSIS — Z125 Encounter for screening for malignant neoplasm of prostate: Secondary | ICD-10-CM | POA: Diagnosis not present

## 2020-12-27 DIAGNOSIS — I10 Essential (primary) hypertension: Secondary | ICD-10-CM | POA: Diagnosis not present

## 2020-12-27 DIAGNOSIS — R5383 Other fatigue: Secondary | ICD-10-CM | POA: Diagnosis not present

## 2020-12-27 DIAGNOSIS — Z789 Other specified health status: Secondary | ICD-10-CM | POA: Diagnosis not present

## 2020-12-27 DIAGNOSIS — C61 Malignant neoplasm of prostate: Secondary | ICD-10-CM | POA: Diagnosis not present

## 2020-12-27 DIAGNOSIS — Z79899 Other long term (current) drug therapy: Secondary | ICD-10-CM | POA: Diagnosis not present

## 2020-12-27 DIAGNOSIS — Z0000 Encounter for general adult medical examination without abnormal findings: Secondary | ICD-10-CM | POA: Diagnosis not present

## 2020-12-27 DIAGNOSIS — Z1331 Encounter for screening for depression: Secondary | ICD-10-CM | POA: Diagnosis not present

## 2020-12-27 DIAGNOSIS — E7800 Pure hypercholesterolemia, unspecified: Secondary | ICD-10-CM | POA: Diagnosis not present

## 2020-12-27 DIAGNOSIS — Z6824 Body mass index (BMI) 24.0-24.9, adult: Secondary | ICD-10-CM | POA: Diagnosis not present

## 2021-01-01 IMAGING — XA IR FLUORO GUIDE NEEDLE PLACEMENT /BIOPSY
1 series · 6 of 6 positions shown · non-contrast
Comparison: none

INDICATION: 89-year-old male with a history of chronically elevated PSA. Two
prior prostate biopsies failed to demonstrate evidence of prostate
carcinoma. However, patient now has multiple new small sclerotic
bone lesions concerning for osseous metastatic disease. Targeted
biopsy of the largest lesion in L2 to establish tissue diagnosis.

[Series 300: line placements · 6 of 6 slices shown]
[im 1/6]
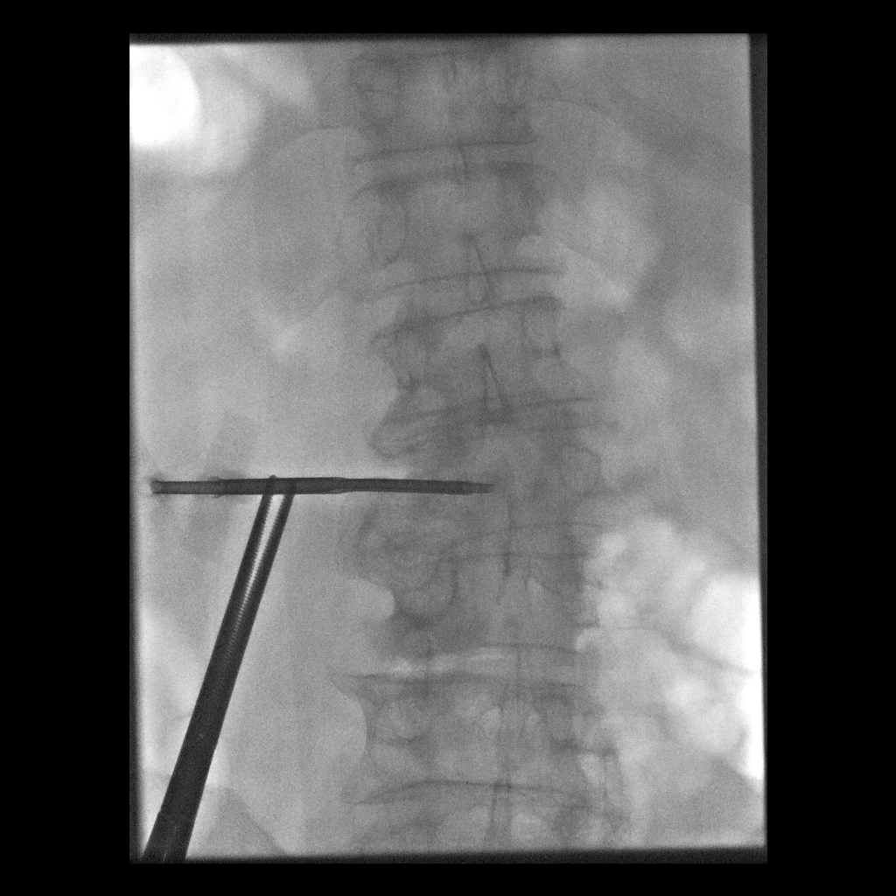
[im 2/6]
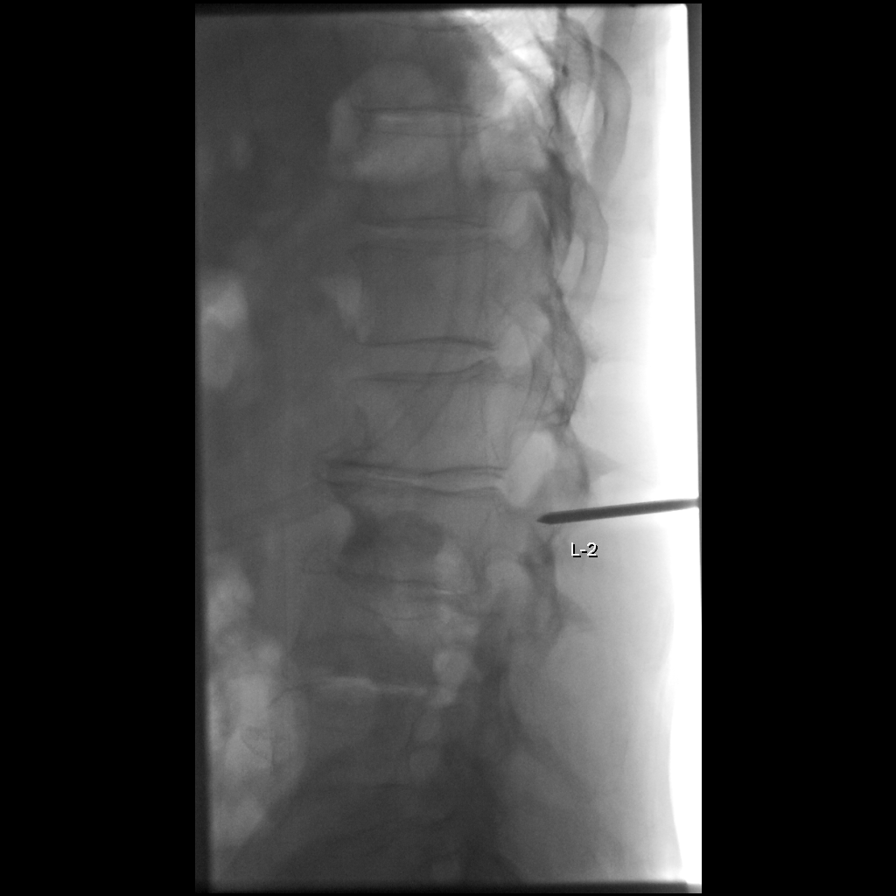
[im 3/6]
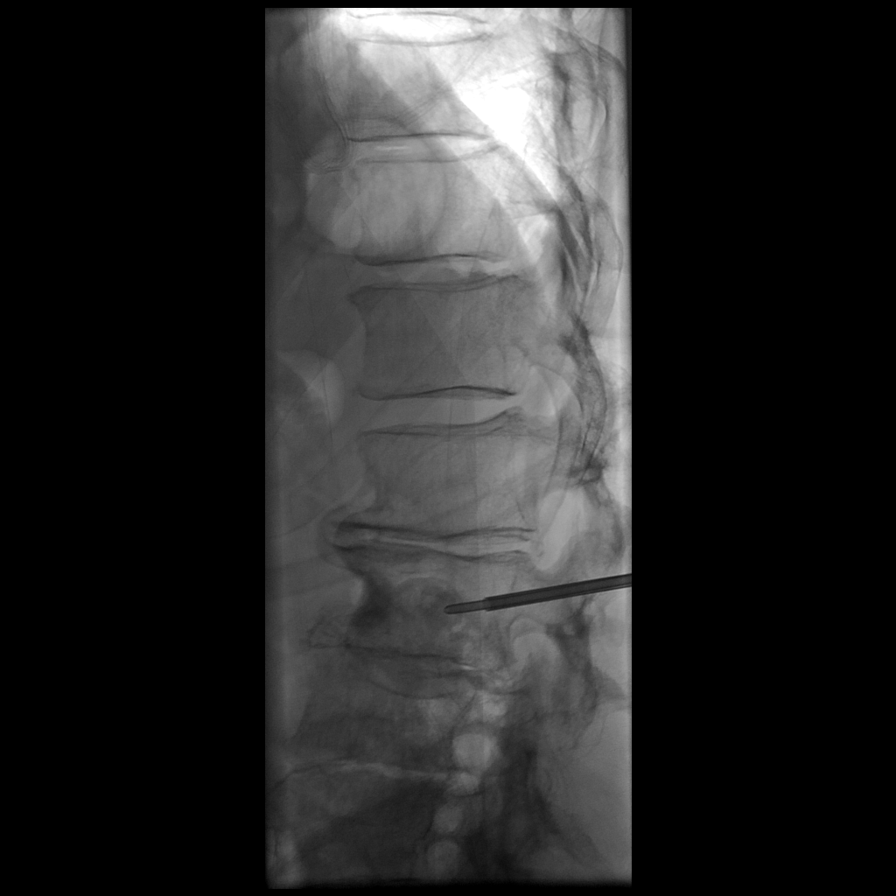
[im 4/6]
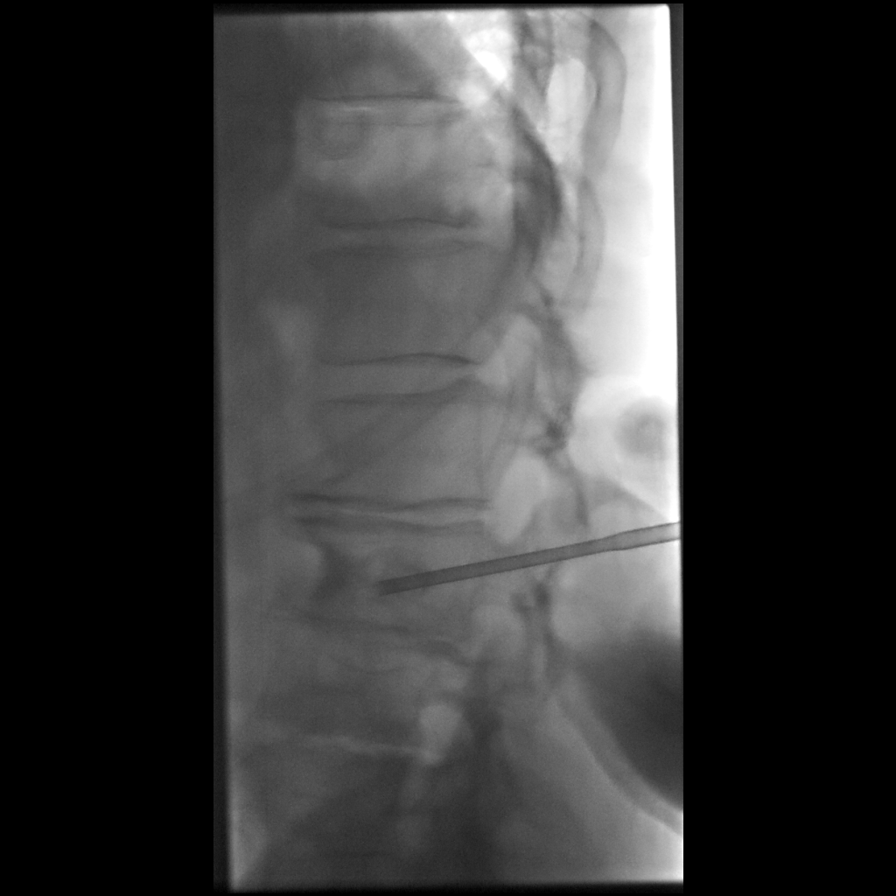
[im 5/6]
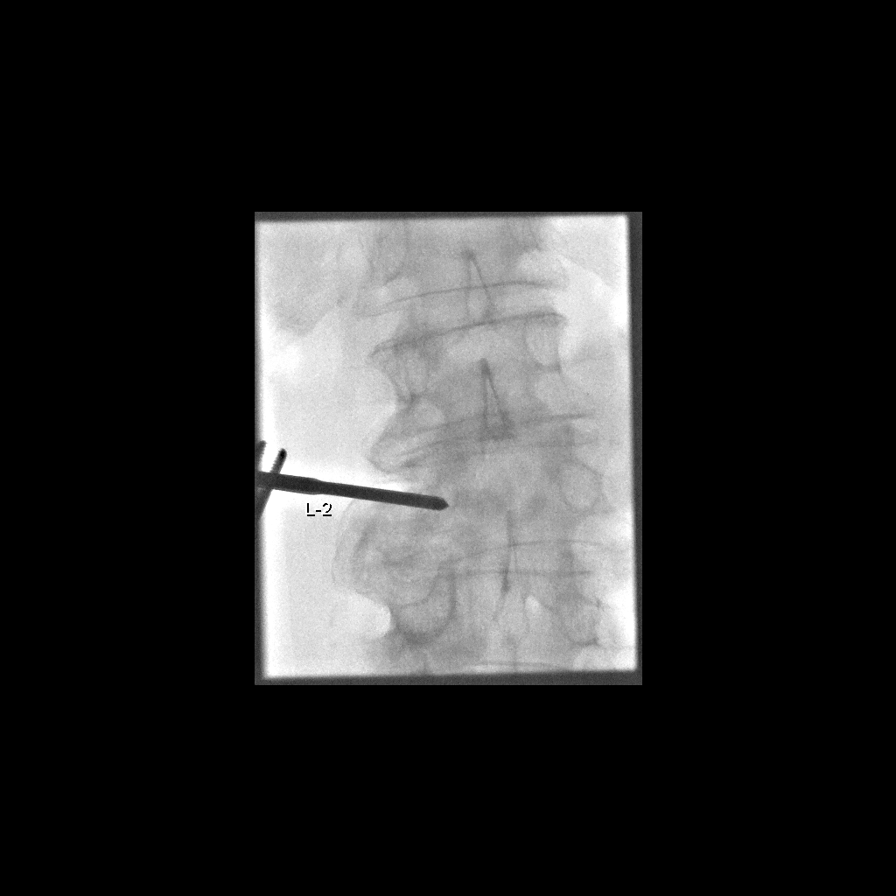
[im 6/6]
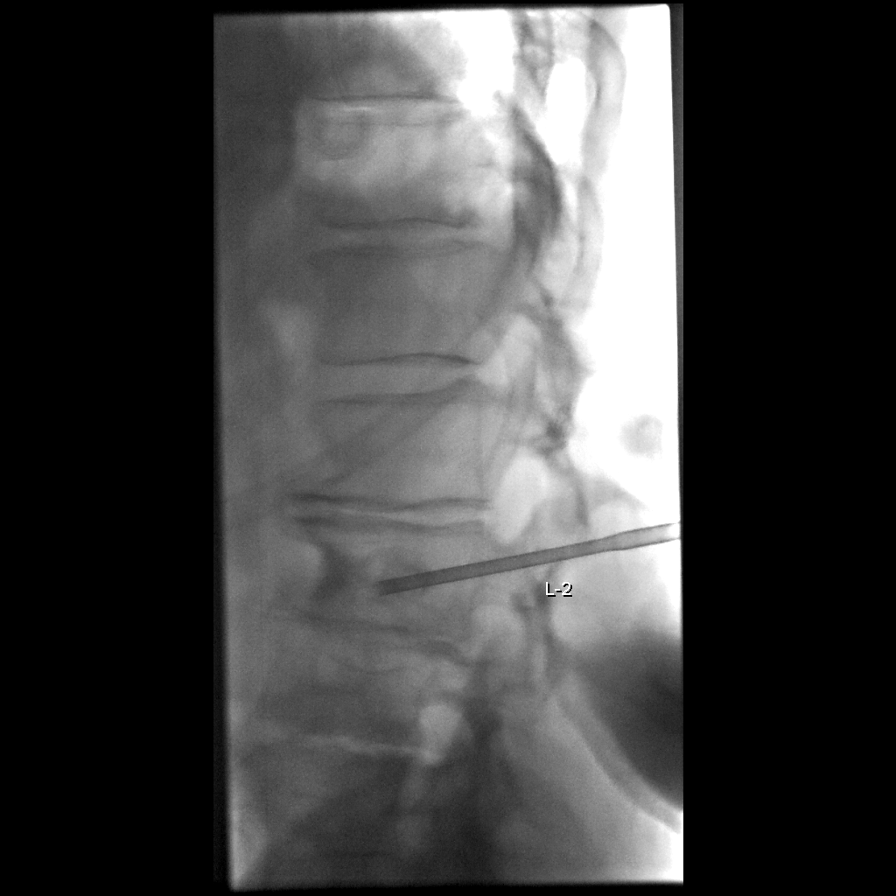

[6 of 6 positions shown; findings below may reference images not displayed]

EXAM:
Transpedicular biopsy of L2 vertebral body using fluoroscopic
guidance

MEDICATIONS:
None.

ANESTHESIA/SEDATION:
Moderate (conscious) sedation was employed during this procedure. A
total of Versed 2 mg and Fentanyl 100 mcg was administered
intravenously.

Moderate Sedation Time: 23 minutes. The patient's level of
consciousness and vital signs were monitored continuously by
radiology nursing throughout the procedure under my direct
supervision.

FLUOROSCOPY TIME:  Fluoroscopy Time: 5 minutes 0 seconds (114 mGy).

COMPLICATIONS:
None immediate.

PROCEDURE:
Informed written consent was obtained from the patient after a
thorough discussion of the procedural risks, benefits and
alternatives. All questions were addressed. Maximal Sterile Barrier
Technique was utilized including caps, mask, sterile gowns, sterile
gloves, sterile drape, hand hygiene and skin antiseptic. A timeout
was performed prior to the initiation of the procedure.

The left pedicle at L2 was successfully identified. An oblique
transpedicular approach was obtained such that the biopsy needle
would intersect with the sclerotic lesion in the right mid aspect of
the L2 vertebral body. Local anesthesia was attained by infiltration
with 1% lidocaine. A 22 gauge Chiba needle was then advanced down on
to the pedicle using fluoroscopic guidance. Additional local
anesthesia was attained by infiltration of more 1% lidocaine.

A dermatotomy was made. Under intermittent fluoroscopic guidance, an
10 gauge [REDACTED] Osteo introducer was carefully docked onto the
pedicle and advanced through the pedicle and into the posterior [DATE]
of the vertebral body. An 11 gauge bone biopsy device was then
advanced coaxially through the Osteo introducer and into the
sclerotic bone lesion. The lesion was very sclerotic. The biopsy
device was removed and small core fragments were successfully
expelled. However, due to the sclerotic nature of the lesion, the
biopsy device was bending rather than affectively coring.

Therefore, the 10 gauge Osteo introducer was carefully advanced
anterior and through the sclerotic bone lesion. The Osteo introducer
was then removed and the bone core expelled into formalin.

Hemostasis was attained by manual pressure. The patient tolerated
the procedure well.
IMPRESSION: Technically successful fluoroscopic guided transpedicular biopsy of
L2 sclerotic lesion.

## 2021-01-19 DIAGNOSIS — C7951 Secondary malignant neoplasm of bone: Secondary | ICD-10-CM | POA: Diagnosis not present

## 2021-01-19 DIAGNOSIS — Z79818 Long term (current) use of other agents affecting estrogen receptors and estrogen levels: Secondary | ICD-10-CM | POA: Diagnosis not present

## 2021-01-19 DIAGNOSIS — R7989 Other specified abnormal findings of blood chemistry: Secondary | ICD-10-CM | POA: Diagnosis not present

## 2021-01-19 DIAGNOSIS — R948 Abnormal results of function studies of other organs and systems: Secondary | ICD-10-CM | POA: Diagnosis not present

## 2021-01-19 DIAGNOSIS — C61 Malignant neoplasm of prostate: Secondary | ICD-10-CM | POA: Diagnosis not present

## 2021-01-19 DIAGNOSIS — Z09 Encounter for follow-up examination after completed treatment for conditions other than malignant neoplasm: Secondary | ICD-10-CM | POA: Diagnosis not present

## 2021-01-22 DIAGNOSIS — C61 Malignant neoplasm of prostate: Secondary | ICD-10-CM | POA: Diagnosis not present

## 2021-01-22 DIAGNOSIS — Z1501 Genetic susceptibility to malignant neoplasm of breast: Secondary | ICD-10-CM | POA: Diagnosis not present

## 2021-01-22 DIAGNOSIS — Z87891 Personal history of nicotine dependence: Secondary | ICD-10-CM | POA: Diagnosis not present

## 2021-01-22 DIAGNOSIS — M25551 Pain in right hip: Secondary | ICD-10-CM | POA: Diagnosis not present

## 2021-01-22 DIAGNOSIS — Z79818 Long term (current) use of other agents affecting estrogen receptors and estrogen levels: Secondary | ICD-10-CM | POA: Diagnosis not present

## 2021-01-22 DIAGNOSIS — Z09 Encounter for follow-up examination after completed treatment for conditions other than malignant neoplasm: Secondary | ICD-10-CM | POA: Diagnosis not present

## 2021-01-22 DIAGNOSIS — R7989 Other specified abnormal findings of blood chemistry: Secondary | ICD-10-CM | POA: Diagnosis not present

## 2021-01-22 DIAGNOSIS — M1990 Unspecified osteoarthritis, unspecified site: Secondary | ICD-10-CM | POA: Diagnosis not present

## 2021-01-22 DIAGNOSIS — E7800 Pure hypercholesterolemia, unspecified: Secondary | ICD-10-CM | POA: Diagnosis not present

## 2021-01-22 DIAGNOSIS — Z1589 Genetic susceptibility to other disease: Secondary | ICD-10-CM | POA: Diagnosis not present

## 2021-01-22 DIAGNOSIS — R9721 Rising PSA following treatment for malignant neoplasm of prostate: Secondary | ICD-10-CM | POA: Diagnosis not present

## 2021-01-22 DIAGNOSIS — C7951 Secondary malignant neoplasm of bone: Secondary | ICD-10-CM | POA: Diagnosis not present

## 2021-01-29 DIAGNOSIS — C61 Malignant neoplasm of prostate: Secondary | ICD-10-CM | POA: Diagnosis not present

## 2021-01-29 DIAGNOSIS — Z5111 Encounter for antineoplastic chemotherapy: Secondary | ICD-10-CM | POA: Diagnosis not present

## 2021-01-29 DIAGNOSIS — C7951 Secondary malignant neoplasm of bone: Secondary | ICD-10-CM | POA: Diagnosis not present

## 2021-02-16 DIAGNOSIS — C61 Malignant neoplasm of prostate: Secondary | ICD-10-CM | POA: Diagnosis not present

## 2021-02-16 DIAGNOSIS — Z09 Encounter for follow-up examination after completed treatment for conditions other than malignant neoplasm: Secondary | ICD-10-CM | POA: Diagnosis not present

## 2021-02-16 DIAGNOSIS — R972 Elevated prostate specific antigen [PSA]: Secondary | ICD-10-CM | POA: Diagnosis not present

## 2021-02-16 DIAGNOSIS — R7989 Other specified abnormal findings of blood chemistry: Secondary | ICD-10-CM | POA: Diagnosis not present

## 2021-02-16 DIAGNOSIS — C7951 Secondary malignant neoplasm of bone: Secondary | ICD-10-CM | POA: Diagnosis not present

## 2021-02-19 DIAGNOSIS — Z09 Encounter for follow-up examination after completed treatment for conditions other than malignant neoplasm: Secondary | ICD-10-CM | POA: Diagnosis not present

## 2021-02-19 DIAGNOSIS — Z87891 Personal history of nicotine dependence: Secondary | ICD-10-CM | POA: Diagnosis not present

## 2021-02-19 DIAGNOSIS — Z1501 Genetic susceptibility to malignant neoplasm of breast: Secondary | ICD-10-CM | POA: Diagnosis not present

## 2021-02-19 DIAGNOSIS — Z79818 Long term (current) use of other agents affecting estrogen receptors and estrogen levels: Secondary | ICD-10-CM | POA: Diagnosis not present

## 2021-02-19 DIAGNOSIS — C61 Malignant neoplasm of prostate: Secondary | ICD-10-CM | POA: Diagnosis not present

## 2021-02-19 DIAGNOSIS — Z1589 Genetic susceptibility to other disease: Secondary | ICD-10-CM | POA: Diagnosis not present

## 2021-02-19 DIAGNOSIS — E7800 Pure hypercholesterolemia, unspecified: Secondary | ICD-10-CM | POA: Diagnosis not present

## 2021-02-19 DIAGNOSIS — Z8042 Family history of malignant neoplasm of prostate: Secondary | ICD-10-CM | POA: Diagnosis not present

## 2021-02-19 DIAGNOSIS — M1990 Unspecified osteoarthritis, unspecified site: Secondary | ICD-10-CM | POA: Diagnosis not present

## 2021-02-19 DIAGNOSIS — C7951 Secondary malignant neoplasm of bone: Secondary | ICD-10-CM | POA: Diagnosis not present

## 2021-03-16 DIAGNOSIS — C7951 Secondary malignant neoplasm of bone: Secondary | ICD-10-CM | POA: Diagnosis not present

## 2021-03-16 DIAGNOSIS — C61 Malignant neoplasm of prostate: Secondary | ICD-10-CM | POA: Diagnosis not present

## 2021-03-16 DIAGNOSIS — Z09 Encounter for follow-up examination after completed treatment for conditions other than malignant neoplasm: Secondary | ICD-10-CM | POA: Diagnosis not present

## 2021-03-19 DIAGNOSIS — Z09 Encounter for follow-up examination after completed treatment for conditions other than malignant neoplasm: Secondary | ICD-10-CM | POA: Diagnosis not present

## 2021-03-19 DIAGNOSIS — N400 Enlarged prostate without lower urinary tract symptoms: Secondary | ICD-10-CM | POA: Diagnosis not present

## 2021-03-19 DIAGNOSIS — Z1589 Genetic susceptibility to other disease: Secondary | ICD-10-CM | POA: Diagnosis not present

## 2021-03-19 DIAGNOSIS — C61 Malignant neoplasm of prostate: Secondary | ICD-10-CM | POA: Diagnosis not present

## 2021-03-19 DIAGNOSIS — E7800 Pure hypercholesterolemia, unspecified: Secondary | ICD-10-CM | POA: Diagnosis not present

## 2021-03-19 DIAGNOSIS — Z8042 Family history of malignant neoplasm of prostate: Secondary | ICD-10-CM | POA: Diagnosis not present

## 2021-03-19 DIAGNOSIS — C7951 Secondary malignant neoplasm of bone: Secondary | ICD-10-CM | POA: Diagnosis not present

## 2021-03-19 DIAGNOSIS — Z1501 Genetic susceptibility to malignant neoplasm of breast: Secondary | ICD-10-CM | POA: Diagnosis not present

## 2021-03-19 DIAGNOSIS — Z79899 Other long term (current) drug therapy: Secondary | ICD-10-CM | POA: Diagnosis not present

## 2021-03-19 DIAGNOSIS — Z79818 Long term (current) use of other agents affecting estrogen receptors and estrogen levels: Secondary | ICD-10-CM | POA: Diagnosis not present

## 2021-04-13 DIAGNOSIS — R972 Elevated prostate specific antigen [PSA]: Secondary | ICD-10-CM | POA: Diagnosis not present

## 2021-04-13 DIAGNOSIS — C7951 Secondary malignant neoplasm of bone: Secondary | ICD-10-CM | POA: Diagnosis not present

## 2021-04-13 DIAGNOSIS — Z09 Encounter for follow-up examination after completed treatment for conditions other than malignant neoplasm: Secondary | ICD-10-CM | POA: Diagnosis not present

## 2021-04-13 DIAGNOSIS — R7989 Other specified abnormal findings of blood chemistry: Secondary | ICD-10-CM | POA: Diagnosis not present

## 2021-04-13 DIAGNOSIS — C61 Malignant neoplasm of prostate: Secondary | ICD-10-CM | POA: Diagnosis not present

## 2021-04-13 DIAGNOSIS — D528 Other folate deficiency anemias: Secondary | ICD-10-CM | POA: Diagnosis not present

## 2021-04-16 DIAGNOSIS — C7951 Secondary malignant neoplasm of bone: Secondary | ICD-10-CM | POA: Diagnosis not present

## 2021-04-16 DIAGNOSIS — C61 Malignant neoplasm of prostate: Secondary | ICD-10-CM | POA: Diagnosis not present

## 2021-04-16 DIAGNOSIS — Z79899 Other long term (current) drug therapy: Secondary | ICD-10-CM | POA: Diagnosis not present

## 2021-04-30 DIAGNOSIS — C7951 Secondary malignant neoplasm of bone: Secondary | ICD-10-CM | POA: Diagnosis not present

## 2021-04-30 DIAGNOSIS — Z5111 Encounter for antineoplastic chemotherapy: Secondary | ICD-10-CM | POA: Diagnosis not present

## 2021-04-30 DIAGNOSIS — C61 Malignant neoplasm of prostate: Secondary | ICD-10-CM | POA: Diagnosis not present

## 2021-05-11 DIAGNOSIS — R972 Elevated prostate specific antigen [PSA]: Secondary | ICD-10-CM | POA: Diagnosis not present

## 2021-05-11 DIAGNOSIS — C61 Malignant neoplasm of prostate: Secondary | ICD-10-CM | POA: Diagnosis not present

## 2021-05-11 DIAGNOSIS — Z09 Encounter for follow-up examination after completed treatment for conditions other than malignant neoplasm: Secondary | ICD-10-CM | POA: Diagnosis not present

## 2021-05-11 DIAGNOSIS — D528 Other folate deficiency anemias: Secondary | ICD-10-CM | POA: Diagnosis not present

## 2021-05-11 DIAGNOSIS — C7951 Secondary malignant neoplasm of bone: Secondary | ICD-10-CM | POA: Diagnosis not present

## 2021-05-11 DIAGNOSIS — R7989 Other specified abnormal findings of blood chemistry: Secondary | ICD-10-CM | POA: Diagnosis not present

## 2021-05-15 DIAGNOSIS — C7951 Secondary malignant neoplasm of bone: Secondary | ICD-10-CM | POA: Diagnosis not present

## 2021-05-15 DIAGNOSIS — C61 Malignant neoplasm of prostate: Secondary | ICD-10-CM | POA: Diagnosis not present

## 2021-05-16 DIAGNOSIS — R69 Illness, unspecified: Secondary | ICD-10-CM | POA: Diagnosis not present

## 2021-06-08 DIAGNOSIS — C61 Malignant neoplasm of prostate: Secondary | ICD-10-CM | POA: Diagnosis not present

## 2021-06-08 DIAGNOSIS — E7800 Pure hypercholesterolemia, unspecified: Secondary | ICD-10-CM | POA: Diagnosis not present

## 2021-06-08 DIAGNOSIS — I10 Essential (primary) hypertension: Secondary | ICD-10-CM | POA: Diagnosis not present

## 2021-06-08 DIAGNOSIS — C7951 Secondary malignant neoplasm of bone: Secondary | ICD-10-CM | POA: Diagnosis not present

## 2021-06-08 DIAGNOSIS — Z299 Encounter for prophylactic measures, unspecified: Secondary | ICD-10-CM | POA: Diagnosis not present

## 2021-06-08 DIAGNOSIS — Z87891 Personal history of nicotine dependence: Secondary | ICD-10-CM | POA: Diagnosis not present

## 2021-06-11 DIAGNOSIS — C7951 Secondary malignant neoplasm of bone: Secondary | ICD-10-CM | POA: Diagnosis not present

## 2021-06-11 DIAGNOSIS — C61 Malignant neoplasm of prostate: Secondary | ICD-10-CM | POA: Diagnosis not present

## 2021-06-11 DIAGNOSIS — R7989 Other specified abnormal findings of blood chemistry: Secondary | ICD-10-CM | POA: Diagnosis not present

## 2021-06-11 DIAGNOSIS — R972 Elevated prostate specific antigen [PSA]: Secondary | ICD-10-CM | POA: Diagnosis not present

## 2021-06-11 DIAGNOSIS — Z09 Encounter for follow-up examination after completed treatment for conditions other than malignant neoplasm: Secondary | ICD-10-CM | POA: Diagnosis not present

## 2021-06-11 DIAGNOSIS — D508 Other iron deficiency anemias: Secondary | ICD-10-CM | POA: Diagnosis not present

## 2021-06-12 DIAGNOSIS — C61 Malignant neoplasm of prostate: Secondary | ICD-10-CM | POA: Diagnosis not present

## 2021-06-12 DIAGNOSIS — C7951 Secondary malignant neoplasm of bone: Secondary | ICD-10-CM | POA: Diagnosis not present

## 2021-07-09 DIAGNOSIS — C7951 Secondary malignant neoplasm of bone: Secondary | ICD-10-CM | POA: Diagnosis not present

## 2021-07-09 DIAGNOSIS — D508 Other iron deficiency anemias: Secondary | ICD-10-CM | POA: Diagnosis not present

## 2021-07-09 DIAGNOSIS — C61 Malignant neoplasm of prostate: Secondary | ICD-10-CM | POA: Diagnosis not present

## 2021-07-09 DIAGNOSIS — Z8042 Family history of malignant neoplasm of prostate: Secondary | ICD-10-CM | POA: Diagnosis not present

## 2021-07-09 DIAGNOSIS — R7989 Other specified abnormal findings of blood chemistry: Secondary | ICD-10-CM | POA: Diagnosis not present

## 2021-07-10 DIAGNOSIS — C7951 Secondary malignant neoplasm of bone: Secondary | ICD-10-CM | POA: Diagnosis not present

## 2021-07-10 DIAGNOSIS — C61 Malignant neoplasm of prostate: Secondary | ICD-10-CM | POA: Diagnosis not present

## 2021-07-27 DIAGNOSIS — R7989 Other specified abnormal findings of blood chemistry: Secondary | ICD-10-CM | POA: Diagnosis not present

## 2021-07-27 DIAGNOSIS — C7951 Secondary malignant neoplasm of bone: Secondary | ICD-10-CM | POA: Diagnosis not present

## 2021-07-27 DIAGNOSIS — R972 Elevated prostate specific antigen [PSA]: Secondary | ICD-10-CM | POA: Diagnosis not present

## 2021-07-27 DIAGNOSIS — C61 Malignant neoplasm of prostate: Secondary | ICD-10-CM | POA: Diagnosis not present

## 2021-07-27 DIAGNOSIS — Z09 Encounter for follow-up examination after completed treatment for conditions other than malignant neoplasm: Secondary | ICD-10-CM | POA: Diagnosis not present

## 2021-07-31 DIAGNOSIS — C61 Malignant neoplasm of prostate: Secondary | ICD-10-CM | POA: Diagnosis not present

## 2021-07-31 DIAGNOSIS — C7951 Secondary malignant neoplasm of bone: Secondary | ICD-10-CM | POA: Diagnosis not present

## 2021-08-06 DIAGNOSIS — Z09 Encounter for follow-up examination after completed treatment for conditions other than malignant neoplasm: Secondary | ICD-10-CM | POA: Diagnosis not present

## 2021-08-06 DIAGNOSIS — R972 Elevated prostate specific antigen [PSA]: Secondary | ICD-10-CM | POA: Diagnosis not present

## 2021-08-06 DIAGNOSIS — C7951 Secondary malignant neoplasm of bone: Secondary | ICD-10-CM | POA: Diagnosis not present

## 2021-08-06 DIAGNOSIS — R7989 Other specified abnormal findings of blood chemistry: Secondary | ICD-10-CM | POA: Diagnosis not present

## 2021-08-06 DIAGNOSIS — C61 Malignant neoplasm of prostate: Secondary | ICD-10-CM | POA: Diagnosis not present

## 2021-08-07 DIAGNOSIS — C7951 Secondary malignant neoplasm of bone: Secondary | ICD-10-CM | POA: Diagnosis not present

## 2021-08-07 DIAGNOSIS — C61 Malignant neoplasm of prostate: Secondary | ICD-10-CM | POA: Diagnosis not present

## 2021-08-31 DIAGNOSIS — C7951 Secondary malignant neoplasm of bone: Secondary | ICD-10-CM | POA: Diagnosis not present

## 2021-08-31 DIAGNOSIS — Z09 Encounter for follow-up examination after completed treatment for conditions other than malignant neoplasm: Secondary | ICD-10-CM | POA: Diagnosis not present

## 2021-08-31 DIAGNOSIS — C61 Malignant neoplasm of prostate: Secondary | ICD-10-CM | POA: Diagnosis not present

## 2021-08-31 DIAGNOSIS — R7989 Other specified abnormal findings of blood chemistry: Secondary | ICD-10-CM | POA: Diagnosis not present

## 2021-08-31 DIAGNOSIS — R972 Elevated prostate specific antigen [PSA]: Secondary | ICD-10-CM | POA: Diagnosis not present

## 2021-09-04 DIAGNOSIS — C61 Malignant neoplasm of prostate: Secondary | ICD-10-CM | POA: Diagnosis not present

## 2021-09-04 DIAGNOSIS — D485 Neoplasm of uncertain behavior of skin: Secondary | ICD-10-CM | POA: Diagnosis not present

## 2021-09-04 DIAGNOSIS — C7951 Secondary malignant neoplasm of bone: Secondary | ICD-10-CM | POA: Diagnosis not present

## 2021-09-04 DIAGNOSIS — Z79899 Other long term (current) drug therapy: Secondary | ICD-10-CM | POA: Diagnosis not present

## 2021-09-04 DIAGNOSIS — L820 Inflamed seborrheic keratosis: Secondary | ICD-10-CM | POA: Diagnosis not present

## 2021-09-04 DIAGNOSIS — L570 Actinic keratosis: Secondary | ICD-10-CM | POA: Diagnosis not present

## 2021-10-01 DIAGNOSIS — C61 Malignant neoplasm of prostate: Secondary | ICD-10-CM | POA: Diagnosis not present

## 2021-10-01 DIAGNOSIS — C7951 Secondary malignant neoplasm of bone: Secondary | ICD-10-CM | POA: Diagnosis not present

## 2021-10-02 DIAGNOSIS — C7951 Secondary malignant neoplasm of bone: Secondary | ICD-10-CM | POA: Diagnosis not present

## 2021-10-02 DIAGNOSIS — C61 Malignant neoplasm of prostate: Secondary | ICD-10-CM | POA: Diagnosis not present

## 2021-10-24 DIAGNOSIS — H524 Presbyopia: Secondary | ICD-10-CM | POA: Diagnosis not present

## 2021-10-24 DIAGNOSIS — H5212 Myopia, left eye: Secondary | ICD-10-CM | POA: Diagnosis not present

## 2021-10-24 DIAGNOSIS — H2513 Age-related nuclear cataract, bilateral: Secondary | ICD-10-CM | POA: Diagnosis not present

## 2021-10-24 DIAGNOSIS — H52221 Regular astigmatism, right eye: Secondary | ICD-10-CM | POA: Diagnosis not present

## 2021-10-24 DIAGNOSIS — H5201 Hypermetropia, right eye: Secondary | ICD-10-CM | POA: Diagnosis not present

## 2021-10-24 DIAGNOSIS — H52222 Regular astigmatism, left eye: Secondary | ICD-10-CM | POA: Diagnosis not present

## 2021-10-26 DIAGNOSIS — Z79818 Long term (current) use of other agents affecting estrogen receptors and estrogen levels: Secondary | ICD-10-CM | POA: Diagnosis not present

## 2021-10-26 DIAGNOSIS — Z1501 Genetic susceptibility to malignant neoplasm of breast: Secondary | ICD-10-CM | POA: Diagnosis not present

## 2021-10-26 DIAGNOSIS — Z1589 Genetic susceptibility to other disease: Secondary | ICD-10-CM | POA: Diagnosis not present

## 2021-10-26 DIAGNOSIS — R7989 Other specified abnormal findings of blood chemistry: Secondary | ICD-10-CM | POA: Diagnosis not present

## 2021-10-26 DIAGNOSIS — Z09 Encounter for follow-up examination after completed treatment for conditions other than malignant neoplasm: Secondary | ICD-10-CM | POA: Diagnosis not present

## 2021-10-26 DIAGNOSIS — C61 Malignant neoplasm of prostate: Secondary | ICD-10-CM | POA: Diagnosis not present

## 2021-10-26 DIAGNOSIS — C7951 Secondary malignant neoplasm of bone: Secondary | ICD-10-CM | POA: Diagnosis not present

## 2021-10-30 DIAGNOSIS — R948 Abnormal results of function studies of other organs and systems: Secondary | ICD-10-CM | POA: Diagnosis not present

## 2021-10-30 DIAGNOSIS — N400 Enlarged prostate without lower urinary tract symptoms: Secondary | ICD-10-CM | POA: Diagnosis not present

## 2021-10-30 DIAGNOSIS — C61 Malignant neoplasm of prostate: Secondary | ICD-10-CM | POA: Diagnosis not present

## 2021-10-30 DIAGNOSIS — C7951 Secondary malignant neoplasm of bone: Secondary | ICD-10-CM | POA: Diagnosis not present

## 2021-10-30 DIAGNOSIS — Z5112 Encounter for antineoplastic immunotherapy: Secondary | ICD-10-CM | POA: Diagnosis not present

## 2021-10-30 DIAGNOSIS — E7800 Pure hypercholesterolemia, unspecified: Secondary | ICD-10-CM | POA: Diagnosis not present

## 2021-12-07 DIAGNOSIS — Z1589 Genetic susceptibility to other disease: Secondary | ICD-10-CM | POA: Diagnosis not present

## 2021-12-07 DIAGNOSIS — Z1501 Genetic susceptibility to malignant neoplasm of breast: Secondary | ICD-10-CM | POA: Diagnosis not present

## 2021-12-07 DIAGNOSIS — C7951 Secondary malignant neoplasm of bone: Secondary | ICD-10-CM | POA: Diagnosis not present

## 2021-12-07 DIAGNOSIS — C61 Malignant neoplasm of prostate: Secondary | ICD-10-CM | POA: Diagnosis not present

## 2021-12-07 DIAGNOSIS — R7989 Other specified abnormal findings of blood chemistry: Secondary | ICD-10-CM | POA: Diagnosis not present

## 2021-12-07 DIAGNOSIS — Z09 Encounter for follow-up examination after completed treatment for conditions other than malignant neoplasm: Secondary | ICD-10-CM | POA: Diagnosis not present

## 2021-12-11 DIAGNOSIS — Z5112 Encounter for antineoplastic immunotherapy: Secondary | ICD-10-CM | POA: Diagnosis not present

## 2021-12-11 DIAGNOSIS — C61 Malignant neoplasm of prostate: Secondary | ICD-10-CM | POA: Diagnosis not present

## 2021-12-11 DIAGNOSIS — C7951 Secondary malignant neoplasm of bone: Secondary | ICD-10-CM | POA: Diagnosis not present

## 2021-12-31 DIAGNOSIS — R5383 Other fatigue: Secondary | ICD-10-CM | POA: Diagnosis not present

## 2021-12-31 DIAGNOSIS — Z299 Encounter for prophylactic measures, unspecified: Secondary | ICD-10-CM | POA: Diagnosis not present

## 2021-12-31 DIAGNOSIS — Z6824 Body mass index (BMI) 24.0-24.9, adult: Secondary | ICD-10-CM | POA: Diagnosis not present

## 2021-12-31 DIAGNOSIS — Z1339 Encounter for screening examination for other mental health and behavioral disorders: Secondary | ICD-10-CM | POA: Diagnosis not present

## 2021-12-31 DIAGNOSIS — I10 Essential (primary) hypertension: Secondary | ICD-10-CM | POA: Diagnosis not present

## 2021-12-31 DIAGNOSIS — Z79899 Other long term (current) drug therapy: Secondary | ICD-10-CM | POA: Diagnosis not present

## 2021-12-31 DIAGNOSIS — C61 Malignant neoplasm of prostate: Secondary | ICD-10-CM | POA: Diagnosis not present

## 2021-12-31 DIAGNOSIS — Z0000 Encounter for general adult medical examination without abnormal findings: Secondary | ICD-10-CM | POA: Diagnosis not present

## 2021-12-31 DIAGNOSIS — Z7189 Other specified counseling: Secondary | ICD-10-CM | POA: Diagnosis not present

## 2021-12-31 DIAGNOSIS — E7800 Pure hypercholesterolemia, unspecified: Secondary | ICD-10-CM | POA: Diagnosis not present

## 2021-12-31 DIAGNOSIS — Z1331 Encounter for screening for depression: Secondary | ICD-10-CM | POA: Diagnosis not present

## 2022-01-08 DIAGNOSIS — Z1501 Genetic susceptibility to malignant neoplasm of breast: Secondary | ICD-10-CM | POA: Diagnosis not present

## 2022-01-08 DIAGNOSIS — Z1589 Genetic susceptibility to other disease: Secondary | ICD-10-CM | POA: Diagnosis not present

## 2022-01-08 DIAGNOSIS — C61 Malignant neoplasm of prostate: Secondary | ICD-10-CM | POA: Diagnosis not present

## 2022-01-08 DIAGNOSIS — Z09 Encounter for follow-up examination after completed treatment for conditions other than malignant neoplasm: Secondary | ICD-10-CM | POA: Diagnosis not present

## 2022-01-08 DIAGNOSIS — C7951 Secondary malignant neoplasm of bone: Secondary | ICD-10-CM | POA: Diagnosis not present

## 2022-01-08 DIAGNOSIS — R7989 Other specified abnormal findings of blood chemistry: Secondary | ICD-10-CM | POA: Diagnosis not present

## 2022-01-14 DIAGNOSIS — H25813 Combined forms of age-related cataract, bilateral: Secondary | ICD-10-CM | POA: Diagnosis not present

## 2022-01-14 DIAGNOSIS — H35373 Puckering of macula, bilateral: Secondary | ICD-10-CM | POA: Diagnosis not present

## 2022-01-14 DIAGNOSIS — H01004 Unspecified blepharitis left upper eyelid: Secondary | ICD-10-CM | POA: Diagnosis not present

## 2022-01-14 DIAGNOSIS — H01001 Unspecified blepharitis right upper eyelid: Secondary | ICD-10-CM | POA: Diagnosis not present

## 2022-01-14 DIAGNOSIS — H01002 Unspecified blepharitis right lower eyelid: Secondary | ICD-10-CM | POA: Diagnosis not present

## 2022-01-25 DIAGNOSIS — D508 Other iron deficiency anemias: Secondary | ICD-10-CM | POA: Diagnosis not present

## 2022-01-25 DIAGNOSIS — R948 Abnormal results of function studies of other organs and systems: Secondary | ICD-10-CM | POA: Diagnosis not present

## 2022-01-25 DIAGNOSIS — Z79818 Long term (current) use of other agents affecting estrogen receptors and estrogen levels: Secondary | ICD-10-CM | POA: Diagnosis not present

## 2022-01-25 DIAGNOSIS — Z09 Encounter for follow-up examination after completed treatment for conditions other than malignant neoplasm: Secondary | ICD-10-CM | POA: Diagnosis not present

## 2022-01-25 DIAGNOSIS — C7951 Secondary malignant neoplasm of bone: Secondary | ICD-10-CM | POA: Diagnosis not present

## 2022-01-25 DIAGNOSIS — R7989 Other specified abnormal findings of blood chemistry: Secondary | ICD-10-CM | POA: Diagnosis not present

## 2022-01-25 DIAGNOSIS — R972 Elevated prostate specific antigen [PSA]: Secondary | ICD-10-CM | POA: Diagnosis not present

## 2022-01-25 DIAGNOSIS — Z1589 Genetic susceptibility to other disease: Secondary | ICD-10-CM | POA: Diagnosis not present

## 2022-01-25 DIAGNOSIS — Z1501 Genetic susceptibility to malignant neoplasm of breast: Secondary | ICD-10-CM | POA: Diagnosis not present

## 2022-01-25 DIAGNOSIS — C61 Malignant neoplasm of prostate: Secondary | ICD-10-CM | POA: Diagnosis not present

## 2022-02-05 DIAGNOSIS — Z79818 Long term (current) use of other agents affecting estrogen receptors and estrogen levels: Secondary | ICD-10-CM | POA: Diagnosis not present

## 2022-02-05 DIAGNOSIS — C61 Malignant neoplasm of prostate: Secondary | ICD-10-CM | POA: Diagnosis not present

## 2022-02-05 DIAGNOSIS — C7951 Secondary malignant neoplasm of bone: Secondary | ICD-10-CM | POA: Diagnosis not present

## 2022-02-05 DIAGNOSIS — Z5112 Encounter for antineoplastic immunotherapy: Secondary | ICD-10-CM | POA: Diagnosis not present

## 2022-02-25 DIAGNOSIS — H25811 Combined forms of age-related cataract, right eye: Secondary | ICD-10-CM | POA: Diagnosis not present

## 2022-02-27 ENCOUNTER — Encounter (HOSPITAL_COMMUNITY): Payer: Self-pay

## 2022-02-27 ENCOUNTER — Encounter (HOSPITAL_COMMUNITY)
Admission: RE | Admit: 2022-02-27 | Discharge: 2022-02-27 | Disposition: A | Discharge status: Home / Self Care | Payer: Medicare HMO | Source: Ambulatory Visit | Attending: Ophthalmology | Admitting: Ophthalmology

## 2022-02-27 HISTORY — DX: Hyperlipidemia, unspecified: E78.5

## 2022-02-27 HISTORY — DX: Malignant neoplasm of prostate: C61

## 2022-02-27 NOTE — H&P (Signed)
Surgical History & Physical ? ?Patient Name: Jesse Walter DOB: Dec 30, 1929 ? ?Surgery: Cataract extraction with intraocular lens implant phacoemulsification; Right Eye ? ?Surgeon: Baruch Goldmann MD ?Surgery Date:  03-04-22 ?Pre-Op Date:  02-25-22 ? ?HPI: ?A 8 Yr. old male patient The patient is here for a cataract evaluation of both eyes, referred from Dr. Hassell Done. Since the last visit, the affected area is worsening. The complaint is associated with light sensitivity. Patient experiences glare on bright sunny days, poor night vision, and driving at night due to glare/halos. Patient is not taking medications. This is negatively affecting the patient's quality of life and the patient is unable to function adequately in life with the current level of vision. HPI was performed by Baruch Goldmann . ? ?Medical History: ?Cancer ? ?Review of Systems ?Negative Allergic/Immunologic ?Negative Cardiovascular ?Negative Constitutional ?Negative Ear, Nose, Mouth & Throat ?Negative Endocrine ?Negative Eyes ?Negative Gastrointestinal ?Negative Genitourinary ?Negative Hemotologic/Lymphatic ?Negative Integumentary ?Negative Musculoskeletal ?Negative Neurological ?Negative Psychiatry ?Negative Respiratory ? ?Social ?  Never smoked  ? ?Medication ?Lovastatin, Leuprolide, Denosumab,  ? ?Sx/Procedures ?Shoulder Repair,  ? ?Drug Allergies  ? NKDA ? ?History & Physical: ?Heent: Cataract, Right Eye ?NECK: supple without bruits ?LUNGS: lungs clear to auscultation ?CV: regular rate and rhythm ?Abdomen: soft and non-tender ? ?Impression & Plan: ?Assessment: ?1.  COMBINED FORMS AGE RELATED CATARACT; Both Eyes (H25.813) ?2.  BLEPHARITIS; Right Upper Lid, Right Lower Lid, Left Upper Lid, Left Lower Lid (H01.001, H01.002,H01.004,H01.005) ?3.  DERMATOCHALASIS, no surgery; Right Upper Lid, Left Upper Lid (W73.710, G26.948) ?4.  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  ASTIGMATISM, REGULAR; Both Eyes (H52.223) ? ?Plan: 1.  Cataract accounts for the  patient's decreased vision. This visual impairment is not correctable with a tolerable change in glasses or contact lenses. Cataract surgery with an implantation of a new lens should significantly improve the visual and functional status of the patient. Discussed all risks, benefits, alternatives, and potential complications. Discussed the procedures and recovery. Patient desires to have surgery. A-scan ordered and performed today for intra-ocular lens calculations. The surgery will be performed in order to improve vision for driving, reading, and for eye examinations. Recommend phacoemulsification with intra-ocular lens. Recommend Dextenza for post-operative pain and inflammation. ?Right Eye. ?Dilates poorly - shugarcaine by protocol. ?Malyugin Ring. ?Omidira. ?Consider Toric Lens - Eyhance. ? ?2.  recommend regular lid cleaning. ? ?3.  Asymptomatic, recommend observation for now. Findings, prognosis and treatment options reviewed. ? ?4.  Asymptomatic. ?Findings, prognosis and treatment options reviewed. ?Symptomatic. ?OCT macula today. ? ?5.  recommend Toric IOL. ?

## 2022-03-04 ENCOUNTER — Ambulatory Visit (HOSPITAL_BASED_OUTPATIENT_CLINIC_OR_DEPARTMENT_OTHER): Payer: Medicare HMO | Admitting: Anesthesiology

## 2022-03-04 ENCOUNTER — Other Ambulatory Visit: Payer: Self-pay

## 2022-03-04 ENCOUNTER — Ambulatory Visit (HOSPITAL_COMMUNITY): Payer: Medicare HMO | Admitting: Anesthesiology

## 2022-03-04 ENCOUNTER — Ambulatory Visit (HOSPITAL_COMMUNITY)
Admission: RE | Admit: 2022-03-04 | Discharge: 2022-03-04 | Disposition: A | Discharge status: Home / Self Care | Payer: Medicare HMO | Attending: Ophthalmology | Admitting: Ophthalmology

## 2022-03-04 ENCOUNTER — Encounter (HOSPITAL_COMMUNITY)
Admission: RE | Disposition: A | Discharge status: Home / Self Care | Payer: Self-pay | Source: Home / Self Care | Attending: Ophthalmology

## 2022-03-04 ENCOUNTER — Encounter (HOSPITAL_COMMUNITY): Payer: Self-pay | Admitting: Ophthalmology

## 2022-03-04 DIAGNOSIS — H0100B Unspecified blepharitis left eye, upper and lower eyelids: Secondary | ICD-10-CM | POA: Insufficient documentation

## 2022-03-04 DIAGNOSIS — H25811 Combined forms of age-related cataract, right eye: Secondary | ICD-10-CM | POA: Diagnosis not present

## 2022-03-04 DIAGNOSIS — C61 Malignant neoplasm of prostate: Secondary | ICD-10-CM | POA: Diagnosis not present

## 2022-03-04 DIAGNOSIS — H0100A Unspecified blepharitis right eye, upper and lower eyelids: Secondary | ICD-10-CM | POA: Insufficient documentation

## 2022-03-04 DIAGNOSIS — H02834 Dermatochalasis of left upper eyelid: Secondary | ICD-10-CM | POA: Insufficient documentation

## 2022-03-04 DIAGNOSIS — H52221 Regular astigmatism, right eye: Secondary | ICD-10-CM | POA: Diagnosis not present

## 2022-03-04 DIAGNOSIS — H52201 Unspecified astigmatism, right eye: Secondary | ICD-10-CM | POA: Diagnosis not present

## 2022-03-04 DIAGNOSIS — H02831 Dermatochalasis of right upper eyelid: Secondary | ICD-10-CM | POA: Diagnosis not present

## 2022-03-04 DIAGNOSIS — H35373 Puckering of macula, bilateral: Secondary | ICD-10-CM | POA: Insufficient documentation

## 2022-03-04 HISTORY — PX: CATARACT EXTRACTION W/PHACO: SHX586

## 2022-03-04 SURGERY — PHACOEMULSIFICATION, CATARACT, WITH IOL INSERTION
Anesthesia: Monitor Anesthesia Care | Site: Eye | Laterality: Right

## 2022-03-04 MED ORDER — LIDOCAINE HCL 3.5 % OP GEL
1.0000 "application " | Freq: Once | OPHTHALMIC | Status: AC
  Administered 2022-03-04: 1 via OPHTHALMIC

## 2022-03-04 MED ORDER — POVIDONE-IODINE 5 % OP SOLN
OPHTHALMIC | Status: DC | PRN
  Administered 2022-03-04: 1 via OPHTHALMIC

## 2022-03-04 MED ORDER — PHENYLEPHRINE-KETOROLAC 1-0.3 % IO SOLN
INTRAOCULAR | Status: DC | PRN
  Administered 2022-03-04: 500 mL via OPHTHALMIC

## 2022-03-04 MED ORDER — PHENYLEPHRINE-KETOROLAC 1-0.3 % IO SOLN
INTRAOCULAR | Status: AC
  Filled 2022-03-04: qty 4

## 2022-03-04 MED ORDER — PHENYLEPHRINE HCL 2.5 % OP SOLN
1.0000 [drp] | OPHTHALMIC | Status: AC | PRN
  Administered 2022-03-04 (×3): 1 [drp] via OPHTHALMIC

## 2022-03-04 MED ORDER — LIDOCAINE HCL (PF) 1 % IJ SOLN
INTRAOCULAR | Status: DC | PRN
  Administered 2022-03-04: 1 mL via OPHTHALMIC

## 2022-03-04 MED ORDER — STERILE WATER FOR IRRIGATION IR SOLN
Status: DC | PRN
  Administered 2022-03-04: 250 mL

## 2022-03-04 MED ORDER — TETRACAINE HCL 0.5 % OP SOLN
1.0000 [drp] | OPHTHALMIC | Status: AC | PRN
  Administered 2022-03-04 (×3): 1 [drp] via OPHTHALMIC

## 2022-03-04 MED ORDER — SODIUM HYALURONATE 10 MG/ML IO SOLUTION
PREFILLED_SYRINGE | INTRAOCULAR | Status: DC | PRN
  Administered 2022-03-04: 0.85 mL via INTRAOCULAR

## 2022-03-04 MED ORDER — BSS IO SOLN
INTRAOCULAR | Status: DC | PRN
  Administered 2022-03-04: 15 mL via INTRAOCULAR

## 2022-03-04 MED ORDER — SODIUM HYALURONATE 23MG/ML IO SOSY
PREFILLED_SYRINGE | INTRAOCULAR | Status: DC | PRN
  Administered 2022-03-04: 0.6 mL via INTRAOCULAR

## 2022-03-04 MED ORDER — NEOMYCIN-POLYMYXIN-DEXAMETH 3.5-10000-0.1 OP SUSP
OPHTHALMIC | Status: DC | PRN
  Administered 2022-03-04: 1 [drp] via OPHTHALMIC

## 2022-03-04 MED ORDER — EPINEPHRINE PF 1 MG/ML IJ SOLN
INTRAMUSCULAR | Status: AC
  Filled 2022-03-04: qty 1

## 2022-03-04 MED ORDER — TROPICAMIDE 1 % OP SOLN
1.0000 [drp] | OPHTHALMIC | Status: AC | PRN
  Administered 2022-03-04 (×3): 1 [drp] via OPHTHALMIC

## 2022-03-04 SURGICAL SUPPLY — 17 items
CATARACT SUITE SIGHTPATH (MISCELLANEOUS) ×2 IMPLANT
CLOTH BEACON ORANGE TIMEOUT ST (SAFETY) ×2 IMPLANT
EYE SHIELD UNIVERSAL CLEAR (GAUZE/BANDAGES/DRESSINGS) ×1 IMPLANT
FEE CATARACT SUITE SIGHTPATH (MISCELLANEOUS) ×1 IMPLANT
GLOVE BIOGEL PI IND STRL 7.5 (GLOVE) IMPLANT
GLOVE BIOGEL PI INDICATOR 7.5 (GLOVE) ×1
LENS IOL EYHANCE TORIC II 21.0 ×2 IMPLANT
LENS IOL EYHANCE TRC 225 21.0 IMPLANT
LENS IOL EYHNC TORIC 225 21.0 ×1 IMPLANT
NDL HYPO 18GX1.5 BLUNT FILL (NEEDLE) ×1 IMPLANT
NEEDLE HYPO 18GX1.5 BLUNT FILL (NEEDLE) ×2 IMPLANT
PAD ARMBOARD 7.5X6 YLW CONV (MISCELLANEOUS) ×2 IMPLANT
RING MALYGIN 7.0 (MISCELLANEOUS) IMPLANT
SYR TB 1ML LL NO SAFETY (SYRINGE) ×2 IMPLANT
TAPE SURG TRANSPORE 1 IN (GAUZE/BANDAGES/DRESSINGS) IMPLANT
TAPE SURGICAL TRANSPORE 1 IN (GAUZE/BANDAGES/DRESSINGS) ×2
WATER STERILE IRR 250ML POUR (IV SOLUTION) ×2 IMPLANT

## 2022-03-04 NOTE — Discharge Instructions (Signed)
Please discharge patient when stable, will follow up today with Dr. Eben Choinski at the [facility] office immediately following discharge.  Leave shield in place until visit.  All paperwork with discharge instructions will be given at the office.   [facility] Address:  [address]  [place], [place] [address]  

## 2022-03-04 NOTE — Op Note (Addendum)
Date of procedure: 03/04/22 ? ?Pre-operative diagnosis: Visually significant age-related combined cataract, Right Eye; Visually Significant Astigmatism, Right Eye (H25.811) ? ?Post-operative diagnosis: Visually significant age-related cataract, Right Eye; Visually Significant Astigmatism, Right Eye ? ?Procedure: Removal of cataract via phacoemulsification and insertion of intra-ocular lens Wynetta Emery and Johnson [id number] +21.0D into the capsular bag of the Right Eye ? ?Attending surgeon: Gerda Diss. Marisa Hua, MD, MA ? ?Anesthesia: MAC, Topical Akten ? ?Complications: None ? ?Estimated Blood Loss: <5m (minimal) ? ?Specimens: None ? ?Implants: As above ? ?Indications:  Visually significant age-related cataract, Right Eye; Visually Significant Astigmatism, Right Eye ? ?Procedure:  ?The patient was seen and identified in the pre-operative area. The operative eye was identified and dilated.  The operative eye was marked.  Pre-operative toric markers were used to mark the eye at 0 and 180 degrees. Topical anesthesia was administered to the operative eye.    ? ?The patient was then to the operative suite and placed in the supine position.  A timeout was performed confirming the patient, procedure to be performed, and all other relevant information.   The patient's face was prepped and draped in the usual fashion for intra-ocular surgery.  A lid speculum was placed into the operative eye and the surgical microscope moved into place and focused.  A superotemporal paracentesis was created using a 20 gauge paracentesis blade.  Shugarcaine was injected into the anterior chamber.  Viscoelastic was injected into the anterior chamber.  A temporal clear-corneal main wound incision was created using a 2.463mmicrokeratome.  A continuous curvilinear capsulorrhexis was initiated using an irrigating cystitome and completed using capsulorrhexis forceps.  Hydrodissection and hydrodeliniation were performed.  Viscoelastic was injected into the  anterior chamber.  A phacoemulsification handpiece and a chopper as a second instrument were used to remove the nucleus and epinucleus. The irrigation/aspiration handpiece was used to remove any remaining cortical material.  ? ?The capsular bag was reinflated with viscoelastic, checked, and found to be intact.  The intraocular lens was inserted into the capsular bag and dialed into place using a Kuglen hook to 12 degrees.  The irrigation/aspiration handpiece was used to remove any remaining viscoelastic.  The clear corneal wound and paracentesis wounds were then hydrated and checked with Weck-Cels to be watertight.  The lid-speculum and drape was removed, and the patient's face was cleaned with a wet and dry 4x4.  Maxitrol was instilled in the eye before a clear shield was taped over the eye. The patient was taken to the post-operative care unit in good condition, having tolerated the procedure well. ? ?Post-Op Instructions: [facility] and will receive all other orders and instructions. ? ?

## 2022-03-04 NOTE — Anesthesia Postprocedure Evaluation (Signed)
Anesthesia Post Note ? ?Patient: Jesse Walter ? ?Procedure(s) Performed: CATARACT EXTRACTION PHACO AND INTRAOCULAR LENS PLACEMENT (IOC) (Right: Eye) ? ?Patient location during evaluation: Phase II ?Anesthesia Type: MAC ?Level of consciousness: awake and alert and oriented ?Pain management: pain level controlled ?Vital Signs Assessment: post-procedure vital signs reviewed and stable ?Respiratory status: spontaneous breathing, nonlabored ventilation and respiratory function stable ?Cardiovascular status: blood pressure returned to baseline and stable ?Postop Assessment: no apparent nausea or vomiting ?Anesthetic complications: no ? ? ?No notable events documented. ? ? ?Last Vitals:  ?Vitals:  ? 03/04/22 1017 03/04/22 1125  ?BP: (!) 153/85 (!) 150/87  ?Pulse: 66 67  ?Resp: 18 16  ?Temp: 36.6 ?C 36.7 ?C  ?SpO2: 100% 96%  ?  ?Last Pain:  ?Vitals:  ? 03/04/22 1125  ?TempSrc: Oral  ?PainSc: 0-No pain  ? ? ?  ?  ?  ?  ?  ?  ? ?Madisyn Mawhinney C Mathea Frieling ? ? ? ? ?

## 2022-03-04 NOTE — Interval H&P Note (Signed)
History and Physical Interval Note: ? ?03/04/2022 ?10:57 AM ? ?Jesse Walter  has presented today for surgery, with the diagnosis of combined forms age related cataract; right.  The various methods of treatment have been discussed with the patient and family. After consideration of risks, benefits and other options for treatment, the patient has consented to  Procedure(s) with comments: ?CATARACT EXTRACTION PHACO AND INTRAOCULAR LENS PLACEMENT (IOC) (Right) - right as a surgical intervention.  The patient's history has been reviewed, patient examined, no change in status, stable for surgery.  I have reviewed the patient's chart and labs.  Questions were answered to the patient's satisfaction.   ? ? ?Baruch Goldmann ? ? ?

## 2022-03-04 NOTE — Transfer of Care (Signed)
Immediate Anesthesia Transfer of Care Note ? ?Patient: Jesse Walter ? ?Procedure(s) Performed: CATARACT EXTRACTION PHACO AND INTRAOCULAR LENS PLACEMENT (IOC) (Right: Eye) ? ?Patient Location: PACU ? ?Anesthesia Type:MAC ? ?Level of Consciousness: awake, alert , oriented and patient cooperative ? ?Airway & Oxygen Therapy: Patient Spontanous Breathing ? ?Post-op Assessment: Report given to RN and Post -op Vital signs reviewed and stable ? ?Post vital signs: Reviewed and stable ? ?Last Vitals:  ?Vitals Value Taken Time  ?BP    ?Temp    ?Pulse    ?Resp    ?SpO2    ? ? ?Last Pain:  ?Vitals:  ? 03/04/22 1017  ?TempSrc: Oral  ?PainSc: 0-No pain  ?   ? ?Patients Stated Pain Goal: 4 (03/04/22 1017) ? ?Complications: No notable events documented. ?

## 2022-03-04 NOTE — Anesthesia Preprocedure Evaluation (Signed)
Anesthesia Evaluation  ?Patient identified by MRN, date of birth, ID band ?Patient awake ? ? ? ?Reviewed: ?Allergy & Precautions, NPO status , Patient's Chart, lab work & pertinent test results ? ?History of Anesthesia Complications ?(+) history of anesthetic complications (difficulty urination after anesthesia ) ? ?Airway ?Mallampati: II ? ?TM Distance: >3 FB ?Neck ROM: Full ? ? ? Dental ? ?(+) Dental Advisory Given ?Crowns :   ?Pulmonary ?neg pulmonary ROS, former smoker,  ?  ? ? ? ? ? ? ? Cardiovascular ?negative cardio ROS ?Normal cardiovascular exam ?Rhythm:Regular Rate:Normal ? ? ?  ?Neuro/Psych ?negative neurological ROS ? negative psych ROS  ? GI/Hepatic ?negative GI ROS, Neg liver ROS,   ?Endo/Other  ?negative endocrine ROS ? Renal/GU ?negative Renal ROS  ? ?Prostate cancer ? ?  ?Musculoskeletal ?negative musculoskeletal ROS ?(+)  ? Abdominal ?  ?Peds ?negative pediatric ROS ?(+)  Hematology ?negative hematology ROS ?(+)   ?Anesthesia Other Findings ?Difficulty hearing  ? Reproductive/Obstetrics ?negative OB ROS ? ?  ? ? ? ? ? ? ? ? ? ? ? ? ? ?  ?  ? ? ? ? ? ? ? ?Anesthesia Physical ?Anesthesia Plan ? ?ASA: 3 ? ?Anesthesia Plan: MAC  ? ?Post-op Pain Management: Minimal or no pain anticipated  ? ?Induction: Intravenous ? ?PONV Risk Score and Plan:  ? ?Airway Management Planned: Nasal Cannula and Natural Airway ? ?Additional Equipment:  ? ?Intra-op Plan:  ? ?Post-operative Plan:  ? ?Informed Consent: I have reviewed the patients History and Physical, chart, labs and discussed the procedure including the risks, benefits and alternatives for the proposed anesthesia with the patient or authorized representative who has indicated his/her understanding and acceptance.  ? ? ? ?Dental advisory given ? ?Plan Discussed with: CRNA and Surgeon ? ?Anesthesia Plan Comments:   ? ? ? ? ? ? ?Anesthesia Quick Evaluation ? ?

## 2022-03-06 ENCOUNTER — Encounter (HOSPITAL_COMMUNITY): Payer: Self-pay | Admitting: Ophthalmology

## 2022-03-06 NOTE — Addendum Note (Signed)
Addendum  created 03/06/22 1107 by Orlie Dakin, CRNA  ? Charge Capture section accepted  ?  ?

## 2022-03-11 DIAGNOSIS — H25812 Combined forms of age-related cataract, left eye: Secondary | ICD-10-CM | POA: Diagnosis not present

## 2022-03-13 ENCOUNTER — Other Ambulatory Visit: Payer: Self-pay

## 2022-03-13 ENCOUNTER — Encounter (HOSPITAL_COMMUNITY)
Admission: RE | Admit: 2022-03-13 | Discharge: 2022-03-13 | Disposition: A | Discharge status: Home / Self Care | Payer: Medicare HMO | Source: Ambulatory Visit | Attending: Ophthalmology | Admitting: Ophthalmology

## 2022-03-13 ENCOUNTER — Encounter (HOSPITAL_COMMUNITY): Payer: Self-pay

## 2022-03-14 NOTE — H&P (Signed)
Surgical History & Physical ? ?Patient Name: Jesse Walter DOB: 12-23-1929 ? ?Surgery: Cataract extraction with intraocular lens implant phacoemulsification; Left Eye ? ?Surgeon: Baruch Goldmann MD ?Surgery Date:  03-18-22 ?Pre-Op Date:  03-11-22 ? ?HPI: ?A 38 Yr. old male patient 1. The patient is returning after cataract post-op. The right eye is affected. Status post cataract post-op CE IOL, which began 1 weeks ago: Since the last visit, the affected area is doing well. The patient's vision is improved. Patient is following medication instructions, the patient is using the combination post op drop TID OD. The patient experiences no eye pain and no flashes, floater, shadow, curtain or veil. The patient also presents for pre op OS. The patient states that he is still having difficulty recognizing people's features when across the room from them. The patient also has difficulty with glare from headlights when driving at night, or in the rain. This is negatively affecting the patient's quality of life and the patient is unable to function adequately in life with the current level of vision. HPI Completed by Dr. Baruch Goldmann ? ?Medical History: ?Cancer ? ?Review of Systems ?Negative Allergic/Immunologic ?Negative Cardiovascular ?Negative Constitutional ?Negative Ear, Nose, Mouth & Throat ?Negative Endocrine ?Negative Eyes ?Negative Gastrointestinal ?Negative Genitourinary ?Negative Hemotologic/Lymphatic ?Negative Integumentary ?Negative Musculoskeletal ?Negative Neurological ?Negative Psychiatry ?Negative Respiratory ? ?Social ?  Never smoked  ? ?Medication ?Prednisolone-Moxifloxacin-Bromfenac,  ?Lovastatin, Leuprolide, Denosumab, ?  ?Sx/Procedures ?Phaco c IOL OD,  ?Shoulder Repair,  ? ?Drug Allergies  ? NKDA ? ?History & Physical: ?Heent: Cataract, Left Eye ?NECK: supple without bruits ?LUNGS: lungs clear to auscultation ?CV: regular rate and rhythm ?Abdomen: soft and non-tender ? ?Impression &  Plan: ?Assessment: ?1.  CATARACT EXTRACTION STATUS; Right Eye (Z98.41) ?2.  COMBINED FORMS AGE RELATED CATARACT; Left Eye [phone]) ?3.  ASTIGMATISM, REGULAR; Both Eyes (H52.223) ? ?Plan: 1.  1 week after cataract surgery. Doing well with improved vision and normal eye pressure. Call with any problems or concerns. ?Continue Pred-Moxi-Brom 2x/day for 3 more weeks. ? ?2.  Cataract accounts for the patient's decreased vision. This visual impairment is not correctable with a tolerable change in glasses or contact lenses. Cataract surgery with an implantation of a new lens should significantly improve the visual and functional status of the patient. Discussed all risks, benefits, alternatives, and potential complications. Discussed the procedures and recovery. Patient desires to have surgery. A-scan ordered and performed today for intra-ocular lens calculations. The surgery will be performed in order to improve vision for driving, reading, and for eye examinations. Recommend phacoemulsification with intra-ocular lens. Recommend Dextenza for post-operative pain and inflammation. ?Left Eye. ?Surgery required to correct imbalance of vision. ?Dilates poorly - shugarcaine by protocol. ?Omidira. ?Toric Lens. ? ?3.  recommend Toric IOL. ?

## 2022-03-18 ENCOUNTER — Ambulatory Visit (HOSPITAL_BASED_OUTPATIENT_CLINIC_OR_DEPARTMENT_OTHER): Payer: Medicare HMO | Admitting: Anesthesiology

## 2022-03-18 ENCOUNTER — Ambulatory Visit (HOSPITAL_COMMUNITY)
Admission: RE | Admit: 2022-03-18 | Discharge: 2022-03-18 | Disposition: A | Discharge status: Home / Self Care | Payer: Medicare HMO | Attending: Ophthalmology | Admitting: Ophthalmology

## 2022-03-18 ENCOUNTER — Ambulatory Visit (HOSPITAL_COMMUNITY): Payer: Medicare HMO | Admitting: Anesthesiology

## 2022-03-18 ENCOUNTER — Encounter (HOSPITAL_COMMUNITY)
Admission: RE | Disposition: A | Discharge status: Home / Self Care | Payer: Self-pay | Source: Home / Self Care | Attending: Ophthalmology

## 2022-03-18 ENCOUNTER — Encounter (HOSPITAL_COMMUNITY): Payer: Self-pay | Admitting: Ophthalmology

## 2022-03-18 DIAGNOSIS — Z8546 Personal history of malignant neoplasm of prostate: Secondary | ICD-10-CM | POA: Diagnosis not present

## 2022-03-18 DIAGNOSIS — H52202 Unspecified astigmatism, left eye: Secondary | ICD-10-CM | POA: Insufficient documentation

## 2022-03-18 DIAGNOSIS — Z87891 Personal history of nicotine dependence: Secondary | ICD-10-CM | POA: Insufficient documentation

## 2022-03-18 DIAGNOSIS — H25812 Combined forms of age-related cataract, left eye: Secondary | ICD-10-CM | POA: Diagnosis not present

## 2022-03-18 DIAGNOSIS — H52222 Regular astigmatism, left eye: Secondary | ICD-10-CM | POA: Diagnosis not present

## 2022-03-18 DIAGNOSIS — H2589 Other age-related cataract: Secondary | ICD-10-CM | POA: Diagnosis present

## 2022-03-18 HISTORY — PX: CATARACT EXTRACTION W/PHACO: SHX586

## 2022-03-18 SURGERY — PHACOEMULSIFICATION, CATARACT, WITH IOL INSERTION
Anesthesia: Monitor Anesthesia Care | Site: Eye | Laterality: Left

## 2022-03-18 MED ORDER — NEOMYCIN-POLYMYXIN-DEXAMETH 3.5-10000-0.1 OP SUSP
OPHTHALMIC | Status: DC | PRN
  Administered 2022-03-18: 2 [drp] via OPHTHALMIC

## 2022-03-18 MED ORDER — STERILE WATER FOR IRRIGATION IR SOLN
Status: DC | PRN
Start: 2022-03-18 — End: 2022-03-18
  Administered 2022-03-18: 500 mL

## 2022-03-18 MED ORDER — LIDOCAINE HCL 3.5 % OP GEL: 1.0000 "application " | Freq: Once | OPHTHALMIC | Status: DC

## 2022-03-18 MED ORDER — PHENYLEPHRINE-KETOROLAC 1-0.3 % IO SOLN
INTRAOCULAR | Status: AC
  Filled 2022-03-18: qty 4

## 2022-03-18 MED ORDER — TROPICAMIDE 1 % OP SOLN
1.0000 [drp] | OPHTHALMIC | Status: AC | PRN
  Administered 2022-03-18 (×3): 1 [drp] via OPHTHALMIC

## 2022-03-18 MED ORDER — TETRACAINE HCL 0.5 % OP SOLN
OPHTHALMIC | Status: AC
  Filled 2022-03-18: qty 4

## 2022-03-18 MED ORDER — EPINEPHRINE PF 1 MG/ML IJ SOLN
INTRAMUSCULAR | Status: AC
  Filled 2022-03-18: qty 1

## 2022-03-18 MED ORDER — TETRACAINE HCL 0.5 % OP SOLN
1.0000 [drp] | OPHTHALMIC | Status: AC | PRN
  Administered 2022-03-18 (×3): 1 [drp] via OPHTHALMIC

## 2022-03-18 MED ORDER — POVIDONE-IODINE 5 % OP SOLN
OPHTHALMIC | Status: DC | PRN
  Administered 2022-03-18: 1 via OPHTHALMIC

## 2022-03-18 MED ORDER — SODIUM HYALURONATE 10 MG/ML IO SOLUTION
PREFILLED_SYRINGE | INTRAOCULAR | Status: DC | PRN
  Administered 2022-03-18: 0.85 mL via INTRAOCULAR

## 2022-03-18 MED ORDER — PHENYLEPHRINE HCL 2.5 % OP SOLN
1.0000 [drp] | OPHTHALMIC | Status: AC | PRN
  Administered 2022-03-18 (×3): 1 [drp] via OPHTHALMIC

## 2022-03-18 MED ORDER — PHENYLEPHRINE-KETOROLAC 1-0.3 % IO SOLN
INTRAOCULAR | Status: DC | PRN
  Administered 2022-03-18: 500 mL via OPHTHALMIC

## 2022-03-18 MED ORDER — SODIUM HYALURONATE 23MG/ML IO SOSY
PREFILLED_SYRINGE | INTRAOCULAR | Status: DC | PRN
  Administered 2022-03-18: 0.6 mL via INTRAOCULAR

## 2022-03-18 MED ORDER — LIDOCAINE HCL (PF) 1 % IJ SOLN
INTRAOCULAR | Status: DC | PRN
  Administered 2022-03-18: 1 mL via OPHTHALMIC

## 2022-03-18 MED ORDER — BSS IO SOLN
INTRAOCULAR | Status: DC | PRN
  Administered 2022-03-18: 15 mL via INTRAOCULAR

## 2022-03-18 SURGICAL SUPPLY — 18 items
CATARACT SUITE SIGHTPATH (MISCELLANEOUS) ×2 IMPLANT
CLOTH BEACON ORANGE TIMEOUT ST (SAFETY) ×2 IMPLANT
EYE SHIELD UNIVERSAL CLEAR (GAUZE/BANDAGES/DRESSINGS) ×1 IMPLANT
FEE CATARACT SUITE SIGHTPATH (MISCELLANEOUS) ×1 IMPLANT
GLOVE BIOGEL PI IND STRL 6.5 (GLOVE) IMPLANT
GLOVE BIOGEL PI IND STRL 7.0 (GLOVE) ×2 IMPLANT
GLOVE BIOGEL PI INDICATOR 6.5 (GLOVE) ×1
GLOVE BIOGEL PI INDICATOR 7.0 (GLOVE) ×1
LENS IOL EYHANCE TORIC II 19.5 ×2 IMPLANT
LENS IOL EYHANCE TRC 225 19.5 IMPLANT
LENS IOL EYHNC TORIC 225 19.5 ×1 IMPLANT
NDL HYPO 18GX1.5 BLUNT FILL (NEEDLE) ×1 IMPLANT
NEEDLE HYPO 18GX1.5 BLUNT FILL (NEEDLE) ×2 IMPLANT
PAD ARMBOARD 7.5X6 YLW CONV (MISCELLANEOUS) ×2 IMPLANT
SYR TB 1ML LL NO SAFETY (SYRINGE) ×2 IMPLANT
TAPE SURG TRANSPORE 1 IN (GAUZE/BANDAGES/DRESSINGS) IMPLANT
TAPE SURGICAL TRANSPORE 1 IN (GAUZE/BANDAGES/DRESSINGS) ×2
WATER STERILE IRR 250ML POUR (IV SOLUTION) ×2 IMPLANT

## 2022-03-18 NOTE — Op Note (Signed)
Date of procedure: 03/18/22 ? ?Pre-operative diagnosis: Visually significant age-related cataract, Left Eye; Visually Significant Astigmatism, Left Eye (H25.?2) ? ?Post-operative diagnosis: Visually significant age-related cataract, Left Eye; Visually Significant Astigmatism, Left Eye ? ?Procedure: Removal of cataract via phacoemulsification and insertion of intra-ocular lens Wynetta Emery and Johnson [id number] +19.5D into the capsular bag of the Left Eye ? ?Attending surgeon: Gerda Diss. Marisa Hua, MD, MA ? ?Anesthesia: MAC, Topical Akten ? ?Complications: None ? ?Estimated Blood Loss: <81m (minimal) ? ?Specimens: None ? ?Implants: As above ? ?Indications:  Visually significant age-related cataract, Left Eye; Visually Significant Astigmatism, Left Eye ? ?Procedure:  ?The patient was seen and identified in the pre-operative area. The operative eye was identified and dilated.  The operative eye was marked.  Pre-operative toric markers were used to mark the eye at 0 and 180 degrees. Topical anesthesia was administered to the operative eye.    ? ?The patient was then to the operative suite and placed in the supine position.  A timeout was performed confirming the patient, procedure to be performed, and all other relevant information.   The patient's face was prepped and draped in the usual fashion for intra-ocular surgery.  A lid speculum was placed into the operative eye and the surgical microscope moved into place and focused.  A superotemporal paracentesis was created using a 20 gauge paracentesis blade.  Shugarcaine was injected into the anterior chamber.  Viscoelastic was injected into the anterior chamber.  A temporal clear-corneal main wound incision was created using a 2.466mmicrokeratome.  A continuous curvilinear capsulorrhexis was initiated using an irrigating cystitome and completed using capsulorrhexis forceps.  Hydrodissection and hydrodeliniation were performed.  Viscoelastic was injected into the anterior chamber.  A  phacoemulsification handpiece and a chopper as a second instrument were used to remove the nucleus and epinucleus. The irrigation/aspiration handpiece was used to remove any remaining cortical material.  ? ?The capsular bag was reinflated with viscoelastic, checked, and found to be intact.  The eye was marked to the per-op meridian.  The intraocular lens was inserted into the capsular bag and dialed into place using a Kuglen hook to 170 degrees.  The irrigation/aspiration handpiece was used to remove any remaining viscoelastic.  The clear corneal wound and paracentesis wounds were then hydrated and checked with Weck-Cels to be watertight.  The lid-speculum and drape was removed, and the patient's face was cleaned with a wet and dry 4x4.  Maxitrol was instilled in the eye before a clear shield was taped over the eye. The patient was taken to the post-operative care unit in good condition, having tolerated the procedure well. ? ?Post-Op Instructions: [facility] and will receive all other orders and instructions. ? ?

## 2022-03-18 NOTE — Discharge Instructions (Signed)
Please discharge patient when stable, will follow up today with Dr. Jadin Creque at the [facility] [place] office immediately following discharge.  Leave shield in place until visit.  All paperwork with discharge instructions will be given at the office.  [facility] [place] Address:  [address]  [place], [place] [address]  

## 2022-03-18 NOTE — Interval H&P Note (Signed)
History and Physical Interval Note: ? ?03/18/2022 ?12:39 PM ? ?Jesse Walter  has presented today for surgery, with the diagnosis of combined forms age related cataract; left.  The various methods of treatment have been discussed with the patient and family. After consideration of risks, benefits and other options for treatment, the patient has consented to  Procedure(s) with comments: ?CATARACT EXTRACTION PHACO AND INTRAOCULAR LENS PLACEMENT (IOC) (Left) - CDE:  as a surgical intervention.  The patient's history has been reviewed, patient examined, no change in status, stable for surgery.  I have reviewed the patient's chart and labs.  Questions were answered to the patient's satisfaction.   ? ? ?Baruch Goldmann ? ? ?

## 2022-03-18 NOTE — Transfer of Care (Signed)
Immediate Anesthesia Transfer of Care Note ? ?Patient: Jesse Walter ? ?Procedure(s) Performed: CATARACT EXTRACTION PHACO AND INTRAOCULAR LENS PLACEMENT (IOC) (Left: Eye) ? ?Patient Location: PACU ? ?Anesthesia Type:MAC ? ?Level of Consciousness: awake, alert  and oriented ? ?Airway & Oxygen Therapy: Patient Spontanous Breathing ? ?Post-op Assessment: Report given to RN, Post -op Vital signs reviewed and stable, Patient moving all extremities X 4 and Patient able to stick tongue midline ? ?Post vital signs: Reviewed ? ?Last Vitals:  ?Vitals Value Taken Time  ?BP    ?Temp    ?Pulse    ?Resp    ?SpO2    ? ? ?Last Pain:  ?Vitals:  ? 03/18/22 1215  ?TempSrc: Oral  ?PainSc: 0-No pain  ?   ? ?Patients Stated Pain Goal: 8 (03/18/22 1215) ? ?Complications: No notable events documented. ?

## 2022-03-18 NOTE — Anesthesia Preprocedure Evaluation (Signed)
Anesthesia Evaluation  ?Patient identified by MRN, date of birth, ID band ?Patient awake ? ? ? ?Reviewed: ?Allergy & Precautions, NPO status , Patient's Chart, lab work & pertinent test results ? ?History of Anesthesia Complications ?(+) history of anesthetic complications (difficulty urination after anesthesia ) ? ?Airway ?Mallampati: II ? ?TM Distance: >3 FB ?Neck ROM: Full ? ? ? Dental ? ?(+) Dental Advisory Given ?Crowns :   ?Pulmonary ?former smoker,  ?  ?Pulmonary exam normal ?breath sounds clear to auscultation ? ? ? ? ? ? Cardiovascular ?negative cardio ROS ?Normal cardiovascular exam ?Rhythm:Regular Rate:Normal ? ? ?  ?Neuro/Psych ?negative neurological ROS ? negative psych ROS  ? GI/Hepatic ?negative GI ROS, Neg liver ROS,   ?Endo/Other  ?negative endocrine ROS ? Renal/GU ?negative Renal ROS  ? ?Prostate cancer ? ?  ?Musculoskeletal ?negative musculoskeletal ROS ?(+)  ? Abdominal ?  ?Peds ?negative pediatric ROS ?(+)  Hematology ?negative hematology ROS ?(+)   ?Anesthesia Other Findings ?Difficulty hearing  ? Reproductive/Obstetrics ?negative OB ROS ? ?  ? ? ? ? ? ? ? ? ? ? ? ? ? ?  ?  ? ? ? ? ? ? ? ? ?Anesthesia Physical ? ?Anesthesia Plan ? ?ASA: 3 ? ?Anesthesia Plan: MAC  ? ?Post-op Pain Management: Minimal or no pain anticipated  ? ?Induction: Intravenous ? ?PONV Risk Score and Plan:  ? ?Airway Management Planned: Nasal Cannula and Natural Airway ? ?Additional Equipment:  ? ?Intra-op Plan:  ? ?Post-operative Plan:  ? ?Informed Consent: I have reviewed the patients History and Physical, chart, labs and discussed the procedure including the risks, benefits and alternatives for the proposed anesthesia with the patient or authorized representative who has indicated his/her understanding and acceptance.  ? ? ? ?Dental advisory given ? ?Plan Discussed with: CRNA and Surgeon ? ?Anesthesia Plan Comments:   ? ? ? ? ? ? ?Anesthesia Quick Evaluation ? ?

## 2022-03-18 NOTE — Anesthesia Postprocedure Evaluation (Signed)
Anesthesia Post Note ? ?Patient: Jesse Walter ? ?Procedure(s) Performed: CATARACT EXTRACTION PHACO AND INTRAOCULAR LENS PLACEMENT (IOC) (Left: Eye) ? ?Patient location during evaluation: Phase II ?Anesthesia Type: MAC ?Level of consciousness: awake and alert and oriented ?Pain management: pain level controlled ?Vital Signs Assessment: post-procedure vital signs reviewed and stable ?Respiratory status: spontaneous breathing, nonlabored ventilation and respiratory function stable ?Cardiovascular status: stable and blood pressure returned to baseline ?Postop Assessment: no apparent nausea or vomiting ?Anesthetic complications: no ? ? ?No notable events documented. ? ? ?Last Vitals:  ?Vitals:  ? 03/18/22 1215 03/18/22 1307  ?BP: (!) 197/84 (!) 163/96  ?Pulse: (!) 58 67  ?Resp: 15 16  ?Temp: 36.7 ?C 36.8 ?C  ?SpO2: 100% 96%  ?  ?Last Pain:  ?Vitals:  ? 03/18/22 1307  ?TempSrc: Axillary  ?PainSc: 0-No pain  ? ? ?  ?  ?  ?  ?  ?  ? ?Kayla Deshaies C Deshanae Lindo ? ? ? ? ?

## 2022-03-19 ENCOUNTER — Encounter (HOSPITAL_COMMUNITY): Payer: Self-pay | Admitting: Ophthalmology

## 2022-04-29 DIAGNOSIS — Z0100 Encounter for examination of eyes and vision without abnormal findings: Secondary | ICD-10-CM | POA: Diagnosis not present

## 2022-05-07 DIAGNOSIS — R972 Elevated prostate specific antigen [PSA]: Secondary | ICD-10-CM | POA: Diagnosis not present

## 2022-05-07 DIAGNOSIS — Z79818 Long term (current) use of other agents affecting estrogen receptors and estrogen levels: Secondary | ICD-10-CM | POA: Diagnosis not present

## 2022-05-07 DIAGNOSIS — Z1501 Genetic susceptibility to malignant neoplasm of breast: Secondary | ICD-10-CM | POA: Diagnosis not present

## 2022-05-07 DIAGNOSIS — Z1589 Genetic susceptibility to other disease: Secondary | ICD-10-CM | POA: Diagnosis not present

## 2022-05-07 DIAGNOSIS — C7951 Secondary malignant neoplasm of bone: Secondary | ICD-10-CM | POA: Diagnosis not present

## 2022-05-07 DIAGNOSIS — D508 Other iron deficiency anemias: Secondary | ICD-10-CM | POA: Diagnosis not present

## 2022-05-07 DIAGNOSIS — R7989 Other specified abnormal findings of blood chemistry: Secondary | ICD-10-CM | POA: Diagnosis not present

## 2022-05-07 DIAGNOSIS — C61 Malignant neoplasm of prostate: Secondary | ICD-10-CM | POA: Diagnosis not present

## 2022-05-07 DIAGNOSIS — Z09 Encounter for follow-up examination after completed treatment for conditions other than malignant neoplasm: Secondary | ICD-10-CM | POA: Diagnosis not present

## 2022-05-08 DIAGNOSIS — C61 Malignant neoplasm of prostate: Secondary | ICD-10-CM | POA: Diagnosis not present

## 2022-05-08 DIAGNOSIS — Z5111 Encounter for antineoplastic chemotherapy: Secondary | ICD-10-CM | POA: Diagnosis not present

## 2022-05-08 DIAGNOSIS — C7951 Secondary malignant neoplasm of bone: Secondary | ICD-10-CM | POA: Diagnosis not present

## 2022-08-05 DIAGNOSIS — E559 Vitamin D deficiency, unspecified: Secondary | ICD-10-CM | POA: Diagnosis not present

## 2022-08-05 DIAGNOSIS — C61 Malignant neoplasm of prostate: Secondary | ICD-10-CM | POA: Diagnosis not present

## 2022-08-05 DIAGNOSIS — Z1501 Genetic susceptibility to malignant neoplasm of breast: Secondary | ICD-10-CM | POA: Diagnosis not present

## 2022-08-05 DIAGNOSIS — Z8042 Family history of malignant neoplasm of prostate: Secondary | ICD-10-CM | POA: Diagnosis not present

## 2022-08-05 DIAGNOSIS — Z09 Encounter for follow-up examination after completed treatment for conditions other than malignant neoplasm: Secondary | ICD-10-CM | POA: Diagnosis not present

## 2022-08-05 DIAGNOSIS — C7951 Secondary malignant neoplasm of bone: Secondary | ICD-10-CM | POA: Diagnosis not present

## 2022-08-05 DIAGNOSIS — R7989 Other specified abnormal findings of blood chemistry: Secondary | ICD-10-CM | POA: Diagnosis not present

## 2022-08-05 DIAGNOSIS — Z1589 Genetic susceptibility to other disease: Secondary | ICD-10-CM | POA: Diagnosis not present

## 2022-08-05 DIAGNOSIS — R972 Elevated prostate specific antigen [PSA]: Secondary | ICD-10-CM | POA: Diagnosis not present

## 2022-08-06 DIAGNOSIS — H9190 Unspecified hearing loss, unspecified ear: Secondary | ICD-10-CM | POA: Diagnosis not present

## 2022-08-06 DIAGNOSIS — C61 Malignant neoplasm of prostate: Secondary | ICD-10-CM | POA: Diagnosis not present

## 2022-08-06 DIAGNOSIS — C7951 Secondary malignant neoplasm of bone: Secondary | ICD-10-CM | POA: Diagnosis not present

## 2022-08-28 DIAGNOSIS — Z1283 Encounter for screening for malignant neoplasm of skin: Secondary | ICD-10-CM | POA: Diagnosis not present

## 2022-08-28 DIAGNOSIS — Z85828 Personal history of other malignant neoplasm of skin: Secondary | ICD-10-CM | POA: Diagnosis not present

## 2022-08-28 DIAGNOSIS — D239 Other benign neoplasm of skin, unspecified: Secondary | ICD-10-CM | POA: Diagnosis not present

## 2022-08-28 DIAGNOSIS — L570 Actinic keratosis: Secondary | ICD-10-CM | POA: Diagnosis not present

## 2022-11-04 DIAGNOSIS — R7989 Other specified abnormal findings of blood chemistry: Secondary | ICD-10-CM | POA: Diagnosis not present

## 2022-11-04 DIAGNOSIS — Z1501 Genetic susceptibility to malignant neoplasm of breast: Secondary | ICD-10-CM | POA: Diagnosis not present

## 2022-11-04 DIAGNOSIS — E559 Vitamin D deficiency, unspecified: Secondary | ICD-10-CM | POA: Diagnosis not present

## 2022-11-04 DIAGNOSIS — C7951 Secondary malignant neoplasm of bone: Secondary | ICD-10-CM | POA: Diagnosis not present

## 2022-11-04 DIAGNOSIS — Z09 Encounter for follow-up examination after completed treatment for conditions other than malignant neoplasm: Secondary | ICD-10-CM | POA: Diagnosis not present

## 2022-11-04 DIAGNOSIS — Z1589 Genetic susceptibility to other disease: Secondary | ICD-10-CM | POA: Diagnosis not present

## 2022-11-04 DIAGNOSIS — C61 Malignant neoplasm of prostate: Secondary | ICD-10-CM | POA: Diagnosis not present

## 2022-11-05 DIAGNOSIS — C7951 Secondary malignant neoplasm of bone: Secondary | ICD-10-CM | POA: Diagnosis not present

## 2022-11-05 DIAGNOSIS — C61 Malignant neoplasm of prostate: Secondary | ICD-10-CM | POA: Diagnosis not present

## 2022-11-27 DIAGNOSIS — Z1501 Genetic susceptibility to malignant neoplasm of breast: Secondary | ICD-10-CM | POA: Diagnosis not present

## 2022-11-27 DIAGNOSIS — C7951 Secondary malignant neoplasm of bone: Secondary | ICD-10-CM | POA: Diagnosis not present

## 2022-11-27 DIAGNOSIS — Z1589 Genetic susceptibility to other disease: Secondary | ICD-10-CM | POA: Diagnosis not present

## 2022-11-27 DIAGNOSIS — Z09 Encounter for follow-up examination after completed treatment for conditions other than malignant neoplasm: Secondary | ICD-10-CM | POA: Diagnosis not present

## 2022-11-27 DIAGNOSIS — C61 Malignant neoplasm of prostate: Secondary | ICD-10-CM | POA: Diagnosis not present

## 2022-11-28 DIAGNOSIS — C61 Malignant neoplasm of prostate: Secondary | ICD-10-CM | POA: Diagnosis not present

## 2022-11-28 DIAGNOSIS — C7951 Secondary malignant neoplasm of bone: Secondary | ICD-10-CM | POA: Diagnosis not present

## 2023-01-06 DIAGNOSIS — Z0000 Encounter for general adult medical examination without abnormal findings: Secondary | ICD-10-CM | POA: Diagnosis not present

## 2023-01-06 DIAGNOSIS — Z299 Encounter for prophylactic measures, unspecified: Secondary | ICD-10-CM | POA: Diagnosis not present

## 2023-01-06 DIAGNOSIS — Z1339 Encounter for screening examination for other mental health and behavioral disorders: Secondary | ICD-10-CM | POA: Diagnosis not present

## 2023-01-06 DIAGNOSIS — C61 Malignant neoplasm of prostate: Secondary | ICD-10-CM | POA: Diagnosis not present

## 2023-01-06 DIAGNOSIS — Z7189 Other specified counseling: Secondary | ICD-10-CM | POA: Diagnosis not present

## 2023-01-06 DIAGNOSIS — R5383 Other fatigue: Secondary | ICD-10-CM | POA: Diagnosis not present

## 2023-01-06 DIAGNOSIS — Z1331 Encounter for screening for depression: Secondary | ICD-10-CM | POA: Diagnosis not present

## 2023-01-06 DIAGNOSIS — D849 Immunodeficiency, unspecified: Secondary | ICD-10-CM | POA: Diagnosis not present

## 2023-01-06 DIAGNOSIS — I10 Essential (primary) hypertension: Secondary | ICD-10-CM | POA: Diagnosis not present

## 2023-01-06 DIAGNOSIS — Z79899 Other long term (current) drug therapy: Secondary | ICD-10-CM | POA: Diagnosis not present

## 2023-01-06 DIAGNOSIS — E7800 Pure hypercholesterolemia, unspecified: Secondary | ICD-10-CM | POA: Diagnosis not present

## 2023-01-06 DIAGNOSIS — C7951 Secondary malignant neoplasm of bone: Secondary | ICD-10-CM | POA: Diagnosis not present

## 2023-02-04 DIAGNOSIS — Z1589 Genetic susceptibility to other disease: Secondary | ICD-10-CM | POA: Diagnosis not present

## 2023-02-04 DIAGNOSIS — C7951 Secondary malignant neoplasm of bone: Secondary | ICD-10-CM | POA: Diagnosis not present

## 2023-02-04 DIAGNOSIS — Z09 Encounter for follow-up examination after completed treatment for conditions other than malignant neoplasm: Secondary | ICD-10-CM | POA: Diagnosis not present

## 2023-02-04 DIAGNOSIS — C61 Malignant neoplasm of prostate: Secondary | ICD-10-CM | POA: Diagnosis not present

## 2023-02-04 DIAGNOSIS — Z79818 Long term (current) use of other agents affecting estrogen receptors and estrogen levels: Secondary | ICD-10-CM | POA: Diagnosis not present

## 2023-02-04 DIAGNOSIS — Z1501 Genetic susceptibility to malignant neoplasm of breast: Secondary | ICD-10-CM | POA: Diagnosis not present

## 2023-02-05 DIAGNOSIS — Z5111 Encounter for antineoplastic chemotherapy: Secondary | ICD-10-CM | POA: Diagnosis not present

## 2023-02-05 DIAGNOSIS — C61 Malignant neoplasm of prostate: Secondary | ICD-10-CM | POA: Diagnosis not present

## 2023-02-05 DIAGNOSIS — C7951 Secondary malignant neoplasm of bone: Secondary | ICD-10-CM | POA: Diagnosis not present

## 2023-02-14 DIAGNOSIS — N400 Enlarged prostate without lower urinary tract symptoms: Secondary | ICD-10-CM | POA: Diagnosis not present

## 2023-02-14 DIAGNOSIS — K529 Noninfective gastroenteritis and colitis, unspecified: Secondary | ICD-10-CM | POA: Diagnosis not present

## 2023-02-14 DIAGNOSIS — R197 Diarrhea, unspecified: Secondary | ICD-10-CM | POA: Diagnosis not present

## 2023-02-14 DIAGNOSIS — Z87891 Personal history of nicotine dependence: Secondary | ICD-10-CM | POA: Diagnosis not present

## 2023-02-14 DIAGNOSIS — I700 Atherosclerosis of aorta: Secondary | ICD-10-CM | POA: Diagnosis not present

## 2023-02-14 DIAGNOSIS — Z1152 Encounter for screening for COVID-19: Secondary | ICD-10-CM | POA: Diagnosis not present

## 2023-02-14 DIAGNOSIS — Z8546 Personal history of malignant neoplasm of prostate: Secondary | ICD-10-CM | POA: Diagnosis not present

## 2023-02-14 DIAGNOSIS — N200 Calculus of kidney: Secondary | ICD-10-CM | POA: Diagnosis not present

## 2023-02-14 DIAGNOSIS — R339 Retention of urine, unspecified: Secondary | ICD-10-CM | POA: Diagnosis not present

## 2023-02-14 DIAGNOSIS — E860 Dehydration: Secondary | ICD-10-CM | POA: Diagnosis not present

## 2023-02-14 DIAGNOSIS — R21 Rash and other nonspecific skin eruption: Secondary | ICD-10-CM | POA: Diagnosis not present

## 2023-02-14 DIAGNOSIS — K3189 Other diseases of stomach and duodenum: Secondary | ICD-10-CM | POA: Diagnosis not present

## 2023-02-15 DIAGNOSIS — E869 Volume depletion, unspecified: Secondary | ICD-10-CM | POA: Diagnosis not present

## 2023-02-15 DIAGNOSIS — E7800 Pure hypercholesterolemia, unspecified: Secondary | ICD-10-CM | POA: Diagnosis not present

## 2023-02-15 DIAGNOSIS — R21 Rash and other nonspecific skin eruption: Secondary | ICD-10-CM | POA: Diagnosis not present

## 2023-02-15 DIAGNOSIS — N400 Enlarged prostate without lower urinary tract symptoms: Secondary | ICD-10-CM | POA: Diagnosis not present

## 2023-02-15 DIAGNOSIS — R197 Diarrhea, unspecified: Secondary | ICD-10-CM | POA: Diagnosis not present

## 2023-02-15 DIAGNOSIS — R69 Illness, unspecified: Secondary | ICD-10-CM | POA: Diagnosis not present

## 2023-02-15 DIAGNOSIS — D72819 Decreased white blood cell count, unspecified: Secondary | ICD-10-CM | POA: Diagnosis not present

## 2023-02-18 DIAGNOSIS — R21 Rash and other nonspecific skin eruption: Secondary | ICD-10-CM | POA: Diagnosis not present

## 2023-02-20 DIAGNOSIS — R197 Diarrhea, unspecified: Secondary | ICD-10-CM | POA: Diagnosis not present

## 2023-02-20 DIAGNOSIS — Z299 Encounter for prophylactic measures, unspecified: Secondary | ICD-10-CM | POA: Diagnosis not present

## 2023-02-20 DIAGNOSIS — E7800 Pure hypercholesterolemia, unspecified: Secondary | ICD-10-CM | POA: Diagnosis not present

## 2023-02-20 DIAGNOSIS — D696 Thrombocytopenia, unspecified: Secondary | ICD-10-CM | POA: Diagnosis not present

## 2023-02-20 DIAGNOSIS — I10 Essential (primary) hypertension: Secondary | ICD-10-CM | POA: Diagnosis not present

## 2023-03-20 DIAGNOSIS — I10 Essential (primary) hypertension: Secondary | ICD-10-CM | POA: Diagnosis not present

## 2023-03-20 DIAGNOSIS — C61 Malignant neoplasm of prostate: Secondary | ICD-10-CM | POA: Diagnosis not present

## 2023-03-20 DIAGNOSIS — C7951 Secondary malignant neoplasm of bone: Secondary | ICD-10-CM | POA: Diagnosis not present

## 2023-03-20 DIAGNOSIS — Z299 Encounter for prophylactic measures, unspecified: Secondary | ICD-10-CM | POA: Diagnosis not present

## 2023-03-20 DIAGNOSIS — D849 Immunodeficiency, unspecified: Secondary | ICD-10-CM | POA: Diagnosis not present

## 2023-03-20 DIAGNOSIS — Z0000 Encounter for general adult medical examination without abnormal findings: Secondary | ICD-10-CM | POA: Diagnosis not present

## 2023-05-13 DIAGNOSIS — R948 Abnormal results of function studies of other organs and systems: Secondary | ICD-10-CM | POA: Diagnosis not present

## 2023-05-13 DIAGNOSIS — Z09 Encounter for follow-up examination after completed treatment for conditions other than malignant neoplasm: Secondary | ICD-10-CM | POA: Diagnosis not present

## 2023-05-13 DIAGNOSIS — C61 Malignant neoplasm of prostate: Secondary | ICD-10-CM | POA: Diagnosis not present

## 2023-05-13 DIAGNOSIS — R972 Elevated prostate specific antigen [PSA]: Secondary | ICD-10-CM | POA: Diagnosis not present

## 2023-05-13 DIAGNOSIS — C7951 Secondary malignant neoplasm of bone: Secondary | ICD-10-CM | POA: Diagnosis not present

## 2023-05-13 DIAGNOSIS — Z79818 Long term (current) use of other agents affecting estrogen receptors and estrogen levels: Secondary | ICD-10-CM | POA: Diagnosis not present

## 2023-05-15 DIAGNOSIS — C7951 Secondary malignant neoplasm of bone: Secondary | ICD-10-CM | POA: Diagnosis not present

## 2023-05-15 DIAGNOSIS — C61 Malignant neoplasm of prostate: Secondary | ICD-10-CM | POA: Diagnosis not present

## 2023-05-27 DIAGNOSIS — E663 Overweight: Secondary | ICD-10-CM | POA: Diagnosis not present

## 2023-05-27 DIAGNOSIS — Z6825 Body mass index (BMI) 25.0-25.9, adult: Secondary | ICD-10-CM | POA: Diagnosis not present

## 2023-05-27 DIAGNOSIS — R030 Elevated blood-pressure reading, without diagnosis of hypertension: Secondary | ICD-10-CM | POA: Diagnosis not present

## 2023-05-27 DIAGNOSIS — S91212A Laceration without foreign body of left great toe with damage to nail, initial encounter: Secondary | ICD-10-CM | POA: Diagnosis not present

## 2023-05-27 DIAGNOSIS — S90212A Contusion of left great toe with damage to nail, initial encounter: Secondary | ICD-10-CM | POA: Diagnosis not present

## 2023-08-05 DIAGNOSIS — C7951 Secondary malignant neoplasm of bone: Secondary | ICD-10-CM | POA: Diagnosis not present

## 2023-08-05 DIAGNOSIS — C61 Malignant neoplasm of prostate: Secondary | ICD-10-CM | POA: Diagnosis not present

## 2023-08-05 DIAGNOSIS — Z79899 Other long term (current) drug therapy: Secondary | ICD-10-CM | POA: Diagnosis not present

## 2023-08-05 DIAGNOSIS — R948 Abnormal results of function studies of other organs and systems: Secondary | ICD-10-CM | POA: Diagnosis not present

## 2023-08-27 DIAGNOSIS — D485 Neoplasm of uncertain behavior of skin: Secondary | ICD-10-CM | POA: Diagnosis not present

## 2023-08-27 DIAGNOSIS — L570 Actinic keratosis: Secondary | ICD-10-CM | POA: Diagnosis not present

## 2023-08-27 DIAGNOSIS — Z85828 Personal history of other malignant neoplasm of skin: Secondary | ICD-10-CM | POA: Diagnosis not present

## 2023-10-30 DIAGNOSIS — C61 Malignant neoplasm of prostate: Secondary | ICD-10-CM | POA: Diagnosis not present

## 2023-10-30 DIAGNOSIS — R948 Abnormal results of function studies of other organs and systems: Secondary | ICD-10-CM | POA: Diagnosis not present

## 2023-10-30 DIAGNOSIS — C7951 Secondary malignant neoplasm of bone: Secondary | ICD-10-CM | POA: Diagnosis not present

## 2023-10-30 DIAGNOSIS — Z79818 Long term (current) use of other agents affecting estrogen receptors and estrogen levels: Secondary | ICD-10-CM | POA: Diagnosis not present

## 2024-01-20 DIAGNOSIS — F03A0 Unspecified dementia, mild, without behavioral disturbance, psychotic disturbance, mood disturbance, and anxiety: Secondary | ICD-10-CM | POA: Diagnosis not present

## 2024-01-20 DIAGNOSIS — Z79899 Other long term (current) drug therapy: Secondary | ICD-10-CM | POA: Diagnosis not present

## 2024-01-20 DIAGNOSIS — E7800 Pure hypercholesterolemia, unspecified: Secondary | ICD-10-CM | POA: Diagnosis not present

## 2024-01-20 DIAGNOSIS — Z0000 Encounter for general adult medical examination without abnormal findings: Secondary | ICD-10-CM | POA: Diagnosis not present

## 2024-01-20 DIAGNOSIS — C61 Malignant neoplasm of prostate: Secondary | ICD-10-CM | POA: Diagnosis not present

## 2024-01-20 DIAGNOSIS — R5383 Other fatigue: Secondary | ICD-10-CM | POA: Diagnosis not present

## 2024-02-03 DIAGNOSIS — Z79818 Long term (current) use of other agents affecting estrogen receptors and estrogen levels: Secondary | ICD-10-CM | POA: Diagnosis not present

## 2024-02-03 DIAGNOSIS — C61 Malignant neoplasm of prostate: Secondary | ICD-10-CM | POA: Diagnosis not present

## 2024-02-03 DIAGNOSIS — C7951 Secondary malignant neoplasm of bone: Secondary | ICD-10-CM | POA: Diagnosis not present

## 2024-05-05 DIAGNOSIS — C7951 Secondary malignant neoplasm of bone: Secondary | ICD-10-CM | POA: Diagnosis not present

## 2024-05-05 DIAGNOSIS — C61 Malignant neoplasm of prostate: Secondary | ICD-10-CM | POA: Diagnosis not present

## 2024-07-12 DIAGNOSIS — L110 Acquired keratosis follicularis: Secondary | ICD-10-CM | POA: Diagnosis not present

## 2024-07-12 DIAGNOSIS — M79672 Pain in left foot: Secondary | ICD-10-CM | POA: Diagnosis not present

## 2024-07-12 DIAGNOSIS — I739 Peripheral vascular disease, unspecified: Secondary | ICD-10-CM | POA: Diagnosis not present

## 2024-07-12 DIAGNOSIS — M79675 Pain in left toe(s): Secondary | ICD-10-CM | POA: Diagnosis not present

## 2024-07-12 DIAGNOSIS — M79671 Pain in right foot: Secondary | ICD-10-CM | POA: Diagnosis not present

## 2024-07-12 DIAGNOSIS — M79674 Pain in right toe(s): Secondary | ICD-10-CM | POA: Diagnosis not present

## 2024-08-03 DIAGNOSIS — C61 Malignant neoplasm of prostate: Secondary | ICD-10-CM | POA: Diagnosis not present

## 2024-08-03 DIAGNOSIS — Z23 Encounter for immunization: Secondary | ICD-10-CM | POA: Diagnosis not present

## 2024-08-03 DIAGNOSIS — C7951 Secondary malignant neoplasm of bone: Secondary | ICD-10-CM | POA: Diagnosis not present

## 2024-08-30 DIAGNOSIS — L573 Poikiloderma of Civatte: Secondary | ICD-10-CM | POA: Diagnosis not present

## 2024-08-30 DIAGNOSIS — L814 Other melanin hyperpigmentation: Secondary | ICD-10-CM | POA: Diagnosis not present

## 2024-08-30 DIAGNOSIS — L821 Other seborrheic keratosis: Secondary | ICD-10-CM | POA: Diagnosis not present

## 2024-09-22 DIAGNOSIS — L110 Acquired keratosis follicularis: Secondary | ICD-10-CM | POA: Diagnosis not present

## 2024-09-22 DIAGNOSIS — M79674 Pain in right toe(s): Secondary | ICD-10-CM | POA: Diagnosis not present

## 2024-09-22 DIAGNOSIS — M79672 Pain in left foot: Secondary | ICD-10-CM | POA: Diagnosis not present

## 2024-09-22 DIAGNOSIS — M79671 Pain in right foot: Secondary | ICD-10-CM | POA: Diagnosis not present

## 2024-09-22 DIAGNOSIS — I739 Peripheral vascular disease, unspecified: Secondary | ICD-10-CM | POA: Diagnosis not present

## 2024-09-22 DIAGNOSIS — M79675 Pain in left toe(s): Secondary | ICD-10-CM | POA: Diagnosis not present

## 2024-11-03 DIAGNOSIS — C61 Malignant neoplasm of prostate: Secondary | ICD-10-CM | POA: Diagnosis not present

## 2024-11-03 DIAGNOSIS — Z79818 Long term (current) use of other agents affecting estrogen receptors and estrogen levels: Secondary | ICD-10-CM | POA: Diagnosis not present

## 2024-11-03 DIAGNOSIS — C7951 Secondary malignant neoplasm of bone: Secondary | ICD-10-CM | POA: Diagnosis not present
